# Patient Record
Sex: Male | Born: 1960 | Race: Black or African American | Hispanic: No | Smoking: Former smoker
Health system: Southern US, Community
[De-identification: ages and names within clinical notes are randomized; demographics above are authoritative.]

## PROBLEM LIST (undated history)

## (undated) DIAGNOSIS — Z923 Personal history of irradiation: Secondary | ICD-10-CM

## (undated) DIAGNOSIS — S0230XA Fracture of orbital floor, unspecified side, initial encounter for closed fracture: Secondary | ICD-10-CM

## (undated) DIAGNOSIS — I2699 Other pulmonary embolism without acute cor pulmonale: Secondary | ICD-10-CM

## (undated) DIAGNOSIS — C61 Malignant neoplasm of prostate: Secondary | ICD-10-CM

## (undated) HISTORY — DX: Personal history of irradiation: Z92.3

## (undated) HISTORY — DX: Malignant neoplasm of prostate: C61

## (undated) HISTORY — PX: SHOULDER ARTHROSCOPY: SHX128

---

## 1998-06-18 ENCOUNTER — Emergency Department (HOSPITAL_COMMUNITY): Admission: EM | Admit: 1998-06-18 | Discharge: 1998-06-18 | Payer: Self-pay | Admitting: Psychology

## 1998-12-23 ENCOUNTER — Encounter: Admission: RE | Admit: 1998-12-23 | Discharge: 1998-12-23 | Payer: Self-pay | Admitting: Family Medicine

## 2001-02-11 ENCOUNTER — Emergency Department (HOSPITAL_COMMUNITY): Admission: EM | Admit: 2001-02-11 | Discharge: 2001-02-11 | Payer: Self-pay | Admitting: Emergency Medicine

## 2001-02-11 ENCOUNTER — Encounter: Payer: Self-pay | Admitting: Emergency Medicine

## 2001-04-04 ENCOUNTER — Encounter: Admission: RE | Admit: 2001-04-04 | Discharge: 2001-04-04 | Payer: Self-pay | Admitting: Family Medicine

## 2001-09-04 ENCOUNTER — Emergency Department (HOSPITAL_COMMUNITY): Admission: EM | Admit: 2001-09-04 | Discharge: 2001-09-04 | Payer: Self-pay | Admitting: Emergency Medicine

## 2001-09-04 ENCOUNTER — Encounter: Payer: Self-pay | Admitting: Emergency Medicine

## 2005-05-28 ENCOUNTER — Emergency Department (HOSPITAL_COMMUNITY): Admission: EM | Admit: 2005-05-28 | Discharge: 2005-05-28 | Payer: Self-pay | Admitting: Family Medicine

## 2007-08-13 ENCOUNTER — Emergency Department (HOSPITAL_COMMUNITY): Admission: EM | Admit: 2007-08-13 | Discharge: 2007-08-13 | Payer: Self-pay | Admitting: Emergency Medicine

## 2009-11-11 ENCOUNTER — Emergency Department (HOSPITAL_COMMUNITY): Admission: EM | Admit: 2009-11-11 | Discharge: 2009-11-11 | Payer: Self-pay | Admitting: Family Medicine

## 2010-06-13 DIAGNOSIS — C61 Malignant neoplasm of prostate: Secondary | ICD-10-CM

## 2010-06-13 HISTORY — DX: Malignant neoplasm of prostate: C61

## 2010-11-12 HISTORY — PX: PROSTATECTOMY: SHX69

## 2010-11-14 ENCOUNTER — Emergency Department (HOSPITAL_COMMUNITY)
Admission: EM | Admit: 2010-11-14 | Discharge: 2010-11-15 | Payer: Self-pay | Source: Home / Self Care | Admitting: Emergency Medicine

## 2011-04-28 ENCOUNTER — Inpatient Hospital Stay (INDEPENDENT_AMBULATORY_CARE_PROVIDER_SITE_OTHER)
Admission: RE | Admit: 2011-04-28 | Discharge: 2011-04-28 | Disposition: A | Discharge status: Home / Self Care | Payer: Non-veteran care | Source: Ambulatory Visit | Attending: Family Medicine | Admitting: Family Medicine

## 2011-04-28 DIAGNOSIS — T148XXA Other injury of unspecified body region, initial encounter: Secondary | ICD-10-CM

## 2011-04-28 DIAGNOSIS — W540XXA Bitten by dog, initial encounter: Secondary | ICD-10-CM

## 2011-05-20 ENCOUNTER — Ambulatory Visit
Admission: RE | Admit: 2011-05-20 | Discharge: 2011-05-20 | Disposition: A | Discharge status: Home / Self Care | Payer: Non-veteran care | Source: Ambulatory Visit | Attending: Radiation Oncology | Admitting: Radiation Oncology

## 2011-05-20 DIAGNOSIS — Z510 Encounter for antineoplastic radiation therapy: Secondary | ICD-10-CM | POA: Insufficient documentation

## 2011-05-20 DIAGNOSIS — C61 Malignant neoplasm of prostate: Secondary | ICD-10-CM | POA: Insufficient documentation

## 2011-05-20 DIAGNOSIS — R5381 Other malaise: Secondary | ICD-10-CM | POA: Insufficient documentation

## 2011-09-11 ENCOUNTER — Ambulatory Visit
Admission: RE | Admit: 2011-09-11 | Discharge: 2011-09-11 | Disposition: A | Discharge status: Home / Self Care | Payer: Non-veteran care | Source: Ambulatory Visit | Attending: Radiation Oncology | Admitting: Radiation Oncology

## 2012-01-14 ENCOUNTER — Encounter: Payer: Self-pay | Admitting: Radiation Oncology

## 2012-01-15 ENCOUNTER — Ambulatory Visit: Payer: Non-veteran care | Attending: Radiation Oncology | Admitting: Radiation Oncology

## 2012-02-04 ENCOUNTER — Encounter: Payer: Self-pay | Admitting: *Deleted

## 2012-02-04 DIAGNOSIS — Z923 Personal history of irradiation: Secondary | ICD-10-CM | POA: Insufficient documentation

## 2012-02-04 NOTE — Progress Notes (Signed)
Sees Dr Jamal Collin, [facility]  Married, 2 children

## 2012-02-05 ENCOUNTER — Encounter: Payer: Self-pay | Admitting: Radiation Oncology

## 2012-02-05 ENCOUNTER — Ambulatory Visit
Admission: RE | Admit: 2012-02-05 | Discharge: 2012-02-05 | Disposition: A | Discharge status: Home / Self Care | Payer: Non-veteran care | Source: Ambulatory Visit | Attending: Radiation Oncology | Admitting: Radiation Oncology

## 2012-02-05 VITALS — BP 121/90 | HR 75 | Temp 97.7°F | Ht 67.0 in | Wt 172.3 lb

## 2012-02-05 DIAGNOSIS — C61 Malignant neoplasm of prostate: Secondary | ICD-10-CM

## 2012-02-05 NOTE — Progress Notes (Addendum)
[name] for Prostate Cancer. Continues on Casodex, and received a Lupron injection Feb. 2013. Hot flashes continue.  Nicholas Ayala reports that he has to wear depends because he has "leakage at night."  He asked about what he can do for this.  Given copy of instructions regarding Kegel exercises.    Nicholas Ayala also reports that whenever he urinates he has leakage from his rectal area and the leakage looks and smells like urine.  Using laxatives at times if constipated  Nicholas Ayala reports that for the past 2-3 weeks he wakes each morning and when standing he experiences "numbness, tingling and tenderness on the bottom of his feet.".  Impotence - Plan to have insertion of the Penile Pump.

## 2012-02-08 ENCOUNTER — Ambulatory Visit: Payer: Non-veteran care | Admitting: Radiation Oncology

## 2012-02-09 NOTE — Progress Notes (Signed)
CC:   Nicholas Collin, MD Lucille Passy, PA-C  DIAGNOSIS:  Prostate cancer.  INTERVAL HISTORY:  Nicholas Ayala returns to clinic today for followup.  He again completed his course of adjuvant radiotherapy to the prostatic fossa in August of 2012.  The patient indicates that he has continued on antihormonal treatment, both with  Casodex and Lupron.  He is having some hot flashes.  The patient has some stable symptoms including some urinary leakage, especially at night.  He has been given instructions regarding Kegel exercises.  The patient also, again, describes what he feels like urine leakage from the rectal area.  The patient is apparently closely followed by Urology at the [facility].  I am requesting additional records from there, but he assures me that he has discussed this issue with Urology and this has been worked up.  He is also planning to have a penile pump inserted as well, so I certainly know that he is being followed by them.  He denies any dysuria or hematuria, and any blood per rectum.  Therefore, I am not sure what to exactly make of his continued symptoms, but we previously discussed him making sure that he is seen by Neurology, and he has done this.  PHYSICAL EXAM:  Vital signs: Weight 172 pounds, blood pressure 121/98, pulse 75, and temperature 97.7.  Cardiovascular:  Regular rate and rhythm.  Respiratory: clear to auscultation.  IMPRESSION AND PLAN:  Nicholas Ayala appears to be doing satisfactorily at this time.  He has no new significant complaints related to his treatment today, and he is in good spirits.  I primarily wanted to make sure that he was not lost to followup in terms of him navigating through system at the [place], and we have discussed in some detail that he is being followed by Neurology for multiple ongoing issues.  I therefore will have him return to our clinic on a p.r.n. basis, and, as noted above, we are trying to gain some additional records so I can have this  for his records here with regards to what is transpiring at the [place].  I spent 15 minutes with Nicholas Ayala today, the majority of which was spent counseling him on his diagnosis and coordinating his care.    ______________________________ Radene Gunning, M.D., Ph.D. JSM/MEDQ  D:  02/09/2012  T:  02/09/2012  Job:  [id number]

## 2014-02-11 DIAGNOSIS — I2699 Other pulmonary embolism without acute cor pulmonale: Secondary | ICD-10-CM

## 2014-02-11 HISTORY — DX: Other pulmonary embolism without acute cor pulmonale: I26.99

## 2014-02-26 ENCOUNTER — Emergency Department (HOSPITAL_COMMUNITY): Payer: Non-veteran care

## 2014-02-26 ENCOUNTER — Inpatient Hospital Stay (HOSPITAL_COMMUNITY)
Admission: EM | Admit: 2014-02-26 | Discharge: 2014-03-12 | DRG: 113 | Disposition: A | Discharge status: Home Health | Payer: Non-veteran care | Attending: General Surgery | Admitting: General Surgery

## 2014-02-26 ENCOUNTER — Encounter (HOSPITAL_COMMUNITY): Payer: Self-pay | Admitting: Emergency Medicine

## 2014-02-26 DIAGNOSIS — I2699 Other pulmonary embolism without acute cor pulmonale: Secondary | ICD-10-CM | POA: Diagnosis present

## 2014-02-26 DIAGNOSIS — I10 Essential (primary) hypertension: Secondary | ICD-10-CM | POA: Diagnosis present

## 2014-02-26 DIAGNOSIS — S32402A Unspecified fracture of left acetabulum, initial encounter for closed fracture: Secondary | ICD-10-CM | POA: Diagnosis present

## 2014-02-26 DIAGNOSIS — I714 Abdominal aortic aneurysm, without rupture, unspecified: Secondary | ICD-10-CM | POA: Diagnosis present

## 2014-02-26 DIAGNOSIS — H11429 Conjunctival edema, unspecified eye: Secondary | ICD-10-CM | POA: Diagnosis present

## 2014-02-26 DIAGNOSIS — S0510XA Contusion of eyeball and orbital tissues, unspecified eye, initial encounter: Secondary | ICD-10-CM | POA: Diagnosis present

## 2014-02-26 DIAGNOSIS — R413 Other amnesia: Secondary | ICD-10-CM | POA: Diagnosis present

## 2014-02-26 DIAGNOSIS — S0230XA Fracture of orbital floor, unspecified side, initial encounter for closed fracture: Secondary | ICD-10-CM

## 2014-02-26 DIAGNOSIS — F17200 Nicotine dependence, unspecified, uncomplicated: Secondary | ICD-10-CM | POA: Diagnosis present

## 2014-02-26 DIAGNOSIS — H209 Unspecified iridocyclitis: Secondary | ICD-10-CM | POA: Diagnosis present

## 2014-02-26 DIAGNOSIS — F1010 Alcohol abuse, uncomplicated: Secondary | ICD-10-CM | POA: Diagnosis present

## 2014-02-26 DIAGNOSIS — R404 Transient alteration of awareness: Secondary | ICD-10-CM | POA: Diagnosis present

## 2014-02-26 DIAGNOSIS — S32511A Fracture of superior rim of right pubis, initial encounter for closed fracture: Secondary | ICD-10-CM | POA: Diagnosis present

## 2014-02-26 DIAGNOSIS — H0520 Unspecified exophthalmos: Secondary | ICD-10-CM | POA: Diagnosis present

## 2014-02-26 DIAGNOSIS — S0292XA Unspecified fracture of facial bones, initial encounter for closed fracture: Secondary | ICD-10-CM | POA: Diagnosis present

## 2014-02-26 DIAGNOSIS — R079 Chest pain, unspecified: Secondary | ICD-10-CM | POA: Diagnosis present

## 2014-02-26 DIAGNOSIS — Z923 Personal history of irradiation: Secondary | ICD-10-CM | POA: Diagnosis not present

## 2014-02-26 DIAGNOSIS — Z8546 Personal history of malignant neoplasm of prostate: Secondary | ICD-10-CM | POA: Diagnosis not present

## 2014-02-26 DIAGNOSIS — D62 Acute posthemorrhagic anemia: Secondary | ICD-10-CM | POA: Diagnosis not present

## 2014-02-26 DIAGNOSIS — X58XXXA Exposure to other specified factors, initial encounter: Secondary | ICD-10-CM

## 2014-02-26 DIAGNOSIS — I723 Aneurysm of iliac artery: Secondary | ICD-10-CM | POA: Diagnosis present

## 2014-02-26 DIAGNOSIS — S32509A Unspecified fracture of unspecified pubis, initial encounter for closed fracture: Secondary | ICD-10-CM | POA: Diagnosis present

## 2014-02-26 DIAGNOSIS — S32409A Unspecified fracture of unspecified acetabulum, initial encounter for closed fracture: Secondary | ICD-10-CM | POA: Diagnosis present

## 2014-02-26 DIAGNOSIS — S0280XA Fracture of other specified skull and facial bones, unspecified side, initial encounter for closed fracture: Secondary | ICD-10-CM | POA: Diagnosis present

## 2014-02-26 DIAGNOSIS — H532 Diplopia: Secondary | ICD-10-CM | POA: Diagnosis present

## 2014-02-26 DIAGNOSIS — S32591A Other specified fracture of right pubis, initial encounter for closed fracture: Secondary | ICD-10-CM | POA: Diagnosis present

## 2014-02-26 DIAGNOSIS — S329XXA Fracture of unspecified parts of lumbosacral spine and pelvis, initial encounter for closed fracture: Secondary | ICD-10-CM | POA: Diagnosis not present

## 2014-02-26 HISTORY — DX: Fracture of orbital floor, unspecified side, initial encounter for closed fracture: S02.30XA

## 2014-02-26 LAB — CBC WITH DIFFERENTIAL/PLATELET
BASOS PCT: 0 % (ref 0–1)
Basophils Absolute: 0 10*3/uL (ref 0.0–0.1)
EOS ABS: 0 10*3/uL (ref 0.0–0.7)
EOS PCT: 0 % (ref 0–5)
HEMATOCRIT: 36.7 % — AB (ref 39.0–52.0)
HEMOGLOBIN: 13.1 g/dL (ref 13.0–17.0)
LYMPHS ABS: 1 10*3/uL (ref 0.7–4.0)
Lymphocytes Relative: 8 % — ABNORMAL LOW (ref 12–46)
MCH: 32.6 pg (ref 26.0–34.0)
MCHC: 35.7 g/dL (ref 30.0–36.0)
MCV: 91.3 fL (ref 78.0–100.0)
MONO ABS: 0.7 10*3/uL (ref 0.1–1.0)
MONOS PCT: 6 % (ref 3–12)
Neutro Abs: 10.6 10*3/uL — ABNORMAL HIGH (ref 1.7–7.7)
Neutrophils Relative %: 86 % — ABNORMAL HIGH (ref 43–77)
Platelets: 262 10*3/uL (ref 150–400)
RBC: 4.02 MIL/uL — AB (ref 4.22–5.81)
RDW: 13.6 % (ref 11.5–15.5)
WBC: 12.4 10*3/uL — ABNORMAL HIGH (ref 4.0–10.5)

## 2014-02-26 LAB — I-STAT CHEM 8, ED
BUN: 14 mg/dL (ref 6–23)
CALCIUM ION: 1.11 mmol/L — AB (ref 1.12–1.23)
CREATININE: 1.6 mg/dL — AB (ref 0.50–1.35)
Chloride: 109 mEq/L (ref 96–112)
GLUCOSE: 98 mg/dL (ref 70–99)
HEMATOCRIT: 40 % (ref 39.0–52.0)
HEMOGLOBIN: 13.6 g/dL (ref 13.0–17.0)
POTASSIUM: 3.6 meq/L — AB (ref 3.7–5.3)
Sodium: 143 mEq/L (ref 137–147)
TCO2: 18 mmol/L (ref 0–100)

## 2014-02-26 LAB — ETHANOL: Alcohol, Ethyl (B): 288 mg/dL — ABNORMAL HIGH (ref 0–11)

## 2014-02-26 MED ORDER — TETANUS-DIPHTH-ACELL PERTUSSIS 5-2.5-18.5 LF-MCG/0.5 IM SUSP
0.5000 mL | Freq: Once | INTRAMUSCULAR | Status: AC
  Administered 2014-02-26: 0.5 mL via INTRAMUSCULAR
  Filled 2014-02-26: qty 0.5

## 2014-02-26 MED ORDER — MORPHINE SULFATE 4 MG/ML IJ SOLN
4.0000 mg | Freq: Once | INTRAMUSCULAR | Status: AC
  Administered 2014-02-26: 4 mg via INTRAVENOUS
  Filled 2014-02-26: qty 1

## 2014-02-26 MED ORDER — ONDANSETRON HCL 4 MG/2ML IJ SOLN
4.0000 mg | Freq: Once | INTRAMUSCULAR | Status: AC
  Administered 2014-02-26: 4 mg via INTRAVENOUS
  Filled 2014-02-26: qty 2

## 2014-02-26 MED ORDER — SODIUM CHLORIDE 0.9 % IV BOLUS (SEPSIS)
1000.0000 mL | Freq: Once | INTRAVENOUS | Status: AC
  Administered 2014-02-26: 1000 mL via INTRAVENOUS

## 2014-02-26 MED ORDER — IOHEXOL 300 MG/ML  SOLN
100.0000 mL | Freq: Once | INTRAMUSCULAR | Status: AC | PRN
  Administered 2014-02-26: 100 mL via INTRAVENOUS

## 2014-02-26 NOTE — ED Notes (Signed)
Wife, Stanton Kidney [phone]
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
## 2014-02-26 NOTE — ED Notes (Signed)
Pt on LSB at this time.
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
## 2014-02-27 ENCOUNTER — Inpatient Hospital Stay (HOSPITAL_COMMUNITY): Payer: Non-veteran care

## 2014-02-27 DIAGNOSIS — H532 Diplopia: Secondary | ICD-10-CM | POA: Diagnosis present

## 2014-02-27 DIAGNOSIS — IMO0002 Reserved for concepts with insufficient information to code with codable children: Secondary | ICD-10-CM

## 2014-02-27 DIAGNOSIS — I10 Essential (primary) hypertension: Secondary | ICD-10-CM | POA: Diagnosis present

## 2014-02-27 DIAGNOSIS — S329XXA Fracture of unspecified parts of lumbosacral spine and pelvis, initial encounter for closed fracture: Secondary | ICD-10-CM | POA: Diagnosis present

## 2014-02-27 DIAGNOSIS — S0292XA Unspecified fracture of facial bones, initial encounter for closed fracture: Secondary | ICD-10-CM | POA: Diagnosis present

## 2014-02-27 DIAGNOSIS — S0280XA Fracture of other specified skull and facial bones, unspecified side, initial encounter for closed fracture: Secondary | ICD-10-CM | POA: Diagnosis present

## 2014-02-27 DIAGNOSIS — X58XXXA Exposure to other specified factors, initial encounter: Secondary | ICD-10-CM | POA: Diagnosis not present

## 2014-02-27 DIAGNOSIS — I723 Aneurysm of iliac artery: Secondary | ICD-10-CM | POA: Diagnosis present

## 2014-02-27 DIAGNOSIS — S0510XA Contusion of eyeball and orbital tissues, unspecified eye, initial encounter: Secondary | ICD-10-CM | POA: Diagnosis present

## 2014-02-27 DIAGNOSIS — Z923 Personal history of irradiation: Secondary | ICD-10-CM | POA: Diagnosis not present

## 2014-02-27 DIAGNOSIS — R404 Transient alteration of awareness: Secondary | ICD-10-CM | POA: Diagnosis present

## 2014-02-27 DIAGNOSIS — S32402A Unspecified fracture of left acetabulum, initial encounter for closed fracture: Secondary | ICD-10-CM | POA: Diagnosis present

## 2014-02-27 DIAGNOSIS — I714 Abdominal aortic aneurysm, without rupture, unspecified: Secondary | ICD-10-CM | POA: Diagnosis present

## 2014-02-27 DIAGNOSIS — S32509A Unspecified fracture of unspecified pubis, initial encounter for closed fracture: Secondary | ICD-10-CM | POA: Diagnosis present

## 2014-02-27 DIAGNOSIS — H0520 Unspecified exophthalmos: Secondary | ICD-10-CM | POA: Diagnosis present

## 2014-02-27 DIAGNOSIS — H11429 Conjunctival edema, unspecified eye: Secondary | ICD-10-CM | POA: Diagnosis present

## 2014-02-27 DIAGNOSIS — R079 Chest pain, unspecified: Secondary | ICD-10-CM | POA: Diagnosis present

## 2014-02-27 DIAGNOSIS — F1010 Alcohol abuse, uncomplicated: Secondary | ICD-10-CM | POA: Diagnosis present

## 2014-02-27 DIAGNOSIS — I2699 Other pulmonary embolism without acute cor pulmonale: Secondary | ICD-10-CM | POA: Diagnosis present

## 2014-02-27 DIAGNOSIS — S0230XA Fracture of orbital floor, unspecified side, initial encounter for closed fracture: Secondary | ICD-10-CM | POA: Diagnosis present

## 2014-02-27 DIAGNOSIS — D62 Acute posthemorrhagic anemia: Secondary | ICD-10-CM | POA: Diagnosis not present

## 2014-02-27 DIAGNOSIS — S32409A Unspecified fracture of unspecified acetabulum, initial encounter for closed fracture: Secondary | ICD-10-CM | POA: Diagnosis present

## 2014-02-27 DIAGNOSIS — H209 Unspecified iridocyclitis: Secondary | ICD-10-CM | POA: Diagnosis present

## 2014-02-27 DIAGNOSIS — R413 Other amnesia: Secondary | ICD-10-CM | POA: Diagnosis present

## 2014-02-27 DIAGNOSIS — Z8546 Personal history of malignant neoplasm of prostate: Secondary | ICD-10-CM | POA: Diagnosis not present

## 2014-02-27 DIAGNOSIS — F17200 Nicotine dependence, unspecified, uncomplicated: Secondary | ICD-10-CM | POA: Diagnosis present

## 2014-02-27 LAB — BASIC METABOLIC PANEL
BUN: 15 mg/dL (ref 6–23)
CALCIUM: 8.4 mg/dL (ref 8.4–10.5)
CO2: 16 mEq/L — ABNORMAL LOW (ref 19–32)
Chloride: 107 mEq/L (ref 96–112)
Creatinine, Ser: 1.13 mg/dL (ref 0.50–1.35)
GFR calc non Af Amer: 72 mL/min — ABNORMAL LOW (ref >90)
GFR, EST AFRICAN AMERICAN: 83 mL/min — AB (ref >90)
Glucose, Bld: 99 mg/dL (ref 70–99)
Potassium: 4.1 mEq/L (ref 3.7–5.3)
SODIUM: 143 meq/L (ref 137–147)

## 2014-02-27 LAB — CBC
HCT: 33.2 % — ABNORMAL LOW (ref 39.0–52.0)
Hemoglobin: 11.5 g/dL — ABNORMAL LOW (ref 13.0–17.0)
MCH: 32.1 pg (ref 26.0–34.0)
MCHC: 34.6 g/dL (ref 30.0–36.0)
MCV: 92.7 fL (ref 78.0–100.0)
PLATELETS: 231 10*3/uL (ref 150–400)
RBC: 3.58 MIL/uL — ABNORMAL LOW (ref 4.22–5.81)
RDW: 13.9 % (ref 11.5–15.5)
WBC: 11.1 10*3/uL — ABNORMAL HIGH (ref 4.0–10.5)

## 2014-02-27 LAB — MRSA PCR SCREENING: MRSA BY PCR: NEGATIVE

## 2014-02-27 MED ORDER — ONDANSETRON HCL 4 MG/2ML IJ SOLN
4.0000 mg | Freq: Four times a day (QID) | INTRAMUSCULAR | Status: DC | PRN
Start: 2014-02-27 — End: 2014-03-12
  Administered 2014-03-04 – 2014-03-08 (×2): 4 mg via INTRAVENOUS
  Filled 2014-02-27 (×2): qty 2

## 2014-02-27 MED ORDER — MORPHINE SULFATE 2 MG/ML IJ SOLN
2.0000 mg | INTRAMUSCULAR | Status: DC | PRN
  Administered 2014-02-27 – 2014-02-28 (×14): 2 mg via INTRAVENOUS
  Filled 2014-02-27 (×14): qty 1

## 2014-02-27 MED ORDER — KCL IN DEXTROSE-NACL 20-5-0.9 MEQ/L-%-% IV SOLN
INTRAVENOUS | Status: DC
  Administered 2014-02-27 – 2014-03-07 (×11): via INTRAVENOUS
  Administered 2014-03-08: 50 mL/h via INTRAVENOUS
  Filled 2014-02-27 (×20): qty 1000

## 2014-02-27 MED ORDER — PANTOPRAZOLE SODIUM 40 MG PO TBEC
40.0000 mg | DELAYED_RELEASE_TABLET | Freq: Every day | ORAL | Status: DC
  Administered 2014-03-01 – 2014-03-06 (×6): 40 mg via ORAL
  Filled 2014-02-27 (×6): qty 1

## 2014-02-27 MED ORDER — ONDANSETRON HCL 4 MG PO TABS: 4.0000 mg | ORAL_TABLET | Freq: Four times a day (QID) | ORAL | Status: DC | PRN

## 2014-02-27 MED ORDER — PANTOPRAZOLE SODIUM 40 MG IV SOLR
40.0000 mg | Freq: Every day | INTRAVENOUS | Status: DC
  Administered 2014-02-27 – 2014-02-28 (×2): 40 mg via INTRAVENOUS
  Filled 2014-02-27 (×3): qty 40
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
## 2014-02-27 NOTE — Progress Notes (Signed)
UR completed.  Meril Dray, RN BSN [facility] CCM Trauma/Neuro ICU Case Manager [phone]  
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
## 2014-02-27 NOTE — Progress Notes (Signed)
Patient ID: Nicholas Ayala, male   DOB: [birth year], 53 y.o.   MRN: [id number] HD stable. In  ICU now. Ortho eval ongoing, noted F/U pelvic film. Georganna Skeans, MD, MPH, FACS Trauma: [phone] General Surgery: [phone]

## 2014-02-27 NOTE — Progress Notes (Signed)
*  PRELIMINARY RESULTS* Vascular Ultrasound Lower extremity venous duplex has been completed.  Preliminary findings: No evidence of DVT.   Landry Mellow, RDMS, RVT  02/27/2014, 10:11 AM

## 2014-02-27 NOTE — ED Notes (Signed)
Pt using urinal and answering/talking on phone.
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
## 2014-02-27 NOTE — Progress Notes (Signed)
Patient ID: Nicholas Ayala, male   DOB: 14-Jan-1961, 53 y.o.   MRN: [id number]  Consult received last night I plan to see and make recommendations later today  In interim will order portable AP pelvis in support of CT  For now NWB, bed rest until fractures better assessed

## 2014-02-28 MED ORDER — HYDROMORPHONE HCL PF 1 MG/ML IJ SOLN
0.5000 mg | INTRAMUSCULAR | Status: DC | PRN
  Administered 2014-02-28 – 2014-03-01 (×4): 2 mg via INTRAVENOUS
  Filled 2014-02-28 (×4): qty 2

## 2014-02-28 MED ORDER — CHLORHEXIDINE GLUCONATE 0.12 % MT SOLN
OROMUCOSAL | Status: AC
  Filled 2014-02-28: qty 15

## 2014-02-28 MED ORDER — OXYCODONE-ACETAMINOPHEN 5-325 MG PO TABS
1.0000 | ORAL_TABLET | ORAL | Status: DC | PRN
  Administered 2014-02-28 – 2014-03-01 (×4): 2 via ORAL
  Filled 2014-02-28 (×4): qty 2
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
## 2014-02-28 NOTE — Progress Notes (Signed)
Patient transferred to [name] with family and belongings at beside. [name] at beside. No new problems at this time. Nicholas Ayala

## 2014-02-28 NOTE — Progress Notes (Signed)
Talked with the orthopedic MD, Dr. Alvan Dame regarding activity progression. Left lower extremity is non weight bearing, and right lower extremity is weight bearing as tolerated. See orders. Plan is to work with PT. No new problems at this time. Will continue to monitor patient. Sandre Kitty

## 2014-02-28 NOTE — Progress Notes (Signed)
PT Cancellation Note  Patient Details Name: Nicholas Ayala MRN: [id number] DOB: Jul 14, 1961   Cancelled Treatment:    Reason Eval Not Completed: Patient not medically ready. Per ortho note, awaiting their decision if pt has Lt acetabular fx and what weightbearing restrictions he will have on LEFT leg. Will proceed once clarified by ortho.   Treshun Wold 02/28/2014, 11:30 AM Pager [phone]
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
## 2014-03-01 ENCOUNTER — Inpatient Hospital Stay (HOSPITAL_COMMUNITY): Payer: Non-veteran care

## 2014-03-01 DIAGNOSIS — S32511A Fracture of superior rim of right pubis, initial encounter for closed fracture: Secondary | ICD-10-CM | POA: Diagnosis present

## 2014-03-01 DIAGNOSIS — D62 Acute posthemorrhagic anemia: Secondary | ICD-10-CM

## 2014-03-01 DIAGNOSIS — S32591A Other specified fracture of right pubis, initial encounter for closed fracture: Secondary | ICD-10-CM | POA: Diagnosis present

## 2014-03-01 DIAGNOSIS — I2699 Other pulmonary embolism without acute cor pulmonale: Secondary | ICD-10-CM | POA: Diagnosis present

## 2014-03-01 LAB — BASIC METABOLIC PANEL
BUN: 9 mg/dL (ref 6–23)
CO2: 22 mEq/L (ref 19–32)
CREATININE: 0.76 mg/dL (ref 0.50–1.35)
Calcium: 9.4 mg/dL (ref 8.4–10.5)
Chloride: 101 mEq/L (ref 96–112)
Glucose, Bld: 137 mg/dL — ABNORMAL HIGH (ref 70–99)
POTASSIUM: 4.2 meq/L (ref 3.7–5.3)
Sodium: 138 mEq/L (ref 137–147)

## 2014-03-01 LAB — PROTIME-INR
INR: 1.01 (ref 0.00–1.49)
PROTHROMBIN TIME: 13.1 s (ref 11.6–15.2)

## 2014-03-01 LAB — HEPARIN LEVEL (UNFRACTIONATED): HEPARIN UNFRACTIONATED: 0.23 [IU]/mL — AB (ref 0.30–0.70)

## 2014-03-01 LAB — APTT: aPTT: 28 s (ref 24–37)

## 2014-03-01 MED ORDER — SERTRALINE HCL 100 MG PO TABS: 100.0000 mg | ORAL_TABLET | Freq: Every day | ORAL | Status: DC

## 2014-03-01 MED ORDER — OXYCODONE HCL 5 MG PO TABS
10.0000 mg | ORAL_TABLET | ORAL | Status: DC | PRN
  Administered 2014-03-01 – 2014-03-05 (×11): 15 mg via ORAL
  Administered 2014-03-05: 20 mg via ORAL
  Administered 2014-03-05: 15 mg via ORAL
  Administered 2014-03-06 (×2): 20 mg via ORAL
  Filled 2014-03-01 (×5): qty 3
  Filled 2014-03-01: qty 4
  Filled 2014-03-01: qty 3
  Filled 2014-03-01: qty 4
  Filled 2014-03-01 (×3): qty 3
  Filled 2014-03-01: qty 4
  Filled 2014-03-01 (×3): qty 3

## 2014-03-01 MED ORDER — HEPARIN (PORCINE) IN NACL 100-0.45 UNIT/ML-% IJ SOLN
1150.0000 [IU]/h | INTRAMUSCULAR | Status: DC
  Administered 2014-03-01: 1000 [IU]/h via INTRAVENOUS
  Filled 2014-03-01 (×2): qty 250

## 2014-03-01 MED ORDER — CITALOPRAM HYDROBROMIDE 10 MG PO TABS
10.0000 mg | ORAL_TABLET | Freq: Every day | ORAL | Status: DC
  Administered 2014-03-01 – 2014-03-12 (×12): 10 mg via ORAL
  Filled 2014-03-01 (×12): qty 1

## 2014-03-01 MED ORDER — CALCIUM CARBONATE-VITAMIN D 500-200 MG-UNIT PO TABS
1.0000 | ORAL_TABLET | Freq: Two times a day (BID) | ORAL | Status: DC
  Administered 2014-03-01 – 2014-03-12 (×23): 1 via ORAL
  Filled 2014-03-01 (×29): qty 1

## 2014-03-01 MED ORDER — HYDROMORPHONE HCL PF 1 MG/ML IJ SOLN
0.5000 mg | INTRAMUSCULAR | Status: DC | PRN
  Administered 2014-03-02 (×2): 0.5 mg via INTRAVENOUS
  Filled 2014-03-01 (×2): qty 1

## 2014-03-01 MED ORDER — HEPARIN BOLUS VIA INFUSION
2000.0000 [IU] | Freq: Once | INTRAVENOUS | Status: AC
  Administered 2014-03-01: 2000 [IU] via INTRAVENOUS
  Filled 2014-03-01: qty 2000

## 2014-03-01 MED ORDER — BICALUTAMIDE 50 MG PO TABS
50.0000 mg | ORAL_TABLET | Freq: Every day | ORAL | Status: DC
Start: 2014-03-01 — End: 2014-03-12
  Administered 2014-03-01 – 2014-03-12 (×12): 50 mg via ORAL
  Filled 2014-03-01 (×12): qty 1

## 2014-03-01 MED ORDER — TRAZODONE HCL 50 MG PO TABS
50.0000 mg | ORAL_TABLET | Freq: Every day | ORAL | Status: DC
  Administered 2014-03-02 – 2014-03-11 (×10): 50 mg via ORAL
  Filled 2014-03-01 (×16): qty 1

## 2014-03-01 NOTE — Progress Notes (Signed)
Doppler studies of the lower extremities are negative.  Subsegmental PEs are real and patient being treated for PE.  I have viewed the study and would like to know the extent of disease a bit better.  Will get formal PE study.  This patient has been seen and I agree with the findings and treatment plan.  [name]. Dahlia Bailiff, MD, [place] [phone] (pager) [phone] (direct pager) Trauma Surgeon
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
## 2014-03-01 NOTE — Progress Notes (Addendum)
PTD #3 ADDENDUM 1503 03/01/14 Pt and wife called with additional questions and concerns. Discussed no surgery is option , but diplopia may be permanent in that case. Pt asked that I call his PCP through [facility] Dr. Geronimo Running [phone] [place] office. Spoke to Environmental consultant; doctor out for rest of day. Provided my contact information. Pt and wife will call again with questions or if they do not want to proceed with surgery at this time.   Pt had emesis with foods, tolerating liquids, no nausea C/o double vision worse when looking to left Starting on heparin gtt  PE Regular Clear EOMI Occlusion normal, normal mouth opening  A/P Continued diplopia in setting of orbital floor fracture on left. Will plan open treatment. Discussed tranconjunctival vs lower lid incision, use of permanent prosthetic material, expected bruising numbness of cheeks. Also discussed that blindness from significant bleeding is a risk, esp in setting of blood thinner use. Will plan hold heparin 6 hrs prior. This was discussed with trauma and orders written. Spoke on phone with wife to discuss plan.  OR am 03/02/14  Irene Limbo, MD [facility] Plastic [facility] Reconstruct[facility] Surgery [phone]
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
## 2014-03-01 NOTE — Progress Notes (Signed)
Rehab Admissions Coordinator Note:  Patient was screened by Retta Diones for appropriateness for an Inpatient Acute Rehab Consult.  At this time, we are recommending [place]. Patient has [employer] insurance and we cannot get authorization from [place] for acute inpatient rehab admission.  Best option is to seek SNF through [place] to see if they will cover.  Call me for questions.  Retta Diones 03/01/2014, 12:50 PM  I can be reached at [phone].
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
## 2014-03-01 NOTE — Progress Notes (Signed)
Patient ID: Nicholas Ayala, male   DOB: June 23, 1961, 53 y.o.   MRN: [id number]  Had a chance to review patients radiographs with radiology  Plain films correlate well with regards to the rami fractures on the right  CT scan shows non displaced left acetabular fracture  Both of these fractures will heal without need for surgery Will treat non-operatively  PT recs orderd  Non-weight bearing left lower extremity  WBAT on the right for transfers This will be required for probably 6 weeks Will follow up in the office in 2 week intervals
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
## 2014-03-02 ENCOUNTER — Inpatient Hospital Stay (HOSPITAL_COMMUNITY): Payer: Non-veteran care | Admitting: Certified Registered Nurse Anesthetist

## 2014-03-02 ENCOUNTER — Encounter (HOSPITAL_COMMUNITY): Payer: Non-veteran care | Admitting: Certified Registered Nurse Anesthetist

## 2014-03-02 ENCOUNTER — Inpatient Hospital Stay (HOSPITAL_COMMUNITY): Payer: Non-veteran care

## 2014-03-02 ENCOUNTER — Encounter (HOSPITAL_COMMUNITY): Admission: EM | Disposition: A | Discharge status: Home Health | Payer: Self-pay | Source: Home / Self Care

## 2014-03-02 HISTORY — PX: ORIF ORBITAL FRACTURE: SHX5312

## 2014-03-02 LAB — CBC
HCT: 31.1 % — ABNORMAL LOW (ref 39.0–52.0)
Hemoglobin: 10.6 g/dL — ABNORMAL LOW (ref 13.0–17.0)
MCH: 31.6 pg (ref 26.0–34.0)
MCHC: 34.1 g/dL (ref 30.0–36.0)
MCV: 92.8 fL (ref 78.0–100.0)
PLATELETS: 217 10*3/uL (ref 150–400)
RBC: 3.35 MIL/uL — ABNORMAL LOW (ref 4.22–5.81)
RDW: 13.4 % (ref 11.5–15.5)
WBC: 4.6 10*3/uL (ref 4.0–10.5)

## 2014-03-02 LAB — HEPARIN LEVEL (UNFRACTIONATED): HEPARIN UNFRACTIONATED: 0.56 [IU]/mL (ref 0.30–0.70)

## 2014-03-02 SURGERY — OPEN REDUCTION INTERNAL FIXATION (ORIF) ORBITAL FRACTURE
Anesthesia: General | Laterality: Left

## 2014-03-02 MED ORDER — PROPOFOL 10 MG/ML IV BOLUS
INTRAVENOUS | Status: AC
  Filled 2014-03-02: qty 20

## 2014-03-02 MED ORDER — HYDRALAZINE HCL 20 MG/ML IJ SOLN
5.0000 mg | INTRAMUSCULAR | Status: DC | PRN
  Administered 2014-03-08: 5 mg via INTRAVENOUS
  Filled 2014-03-02: qty 1

## 2014-03-02 MED ORDER — SUCCINYLCHOLINE CHLORIDE 20 MG/ML IJ SOLN
INTRAMUSCULAR | Status: AC
  Filled 2014-03-02: qty 1

## 2014-03-02 MED ORDER — PHENYLEPHRINE 40 MCG/ML (10ML) SYRINGE FOR IV PUSH (FOR BLOOD PRESSURE SUPPORT)
PREFILLED_SYRINGE | INTRAVENOUS | Status: AC
  Filled 2014-03-02: qty 10

## 2014-03-02 MED ORDER — TOBRAMYCIN 0.3 % OP OINT
TOPICAL_OINTMENT | OPHTHALMIC | Status: DC | PRN
  Administered 2014-03-02: 1 via OPHTHALMIC

## 2014-03-02 MED ORDER — METOPROLOL TARTRATE 1 MG/ML IV SOLN
5.0000 mg | Freq: Four times a day (QID) | INTRAVENOUS | Status: DC
  Administered 2014-03-02 – 2014-03-03 (×3): 5 mg via INTRAVENOUS
  Filled 2014-03-02 (×7): qty 5

## 2014-03-02 MED ORDER — LACTATED RINGERS IV SOLN
INTRAVENOUS | Status: DC
  Administered 2014-03-02: 11:00:00 via INTRAVENOUS

## 2014-03-02 MED ORDER — HYDROMORPHONE HCL PF 1 MG/ML IJ SOLN: 0.2500 mg | INTRAMUSCULAR | Status: DC | PRN

## 2014-03-02 MED ORDER — LIDOCAINE-EPINEPHRINE 1 %-1:100000 IJ SOLN
INTRAMUSCULAR | Status: AC
  Filled 2014-03-02: qty 1

## 2014-03-02 MED ORDER — MIDAZOLAM HCL 2 MG/2ML IJ SOLN
INTRAMUSCULAR | Status: AC
  Filled 2014-03-02: qty 2

## 2014-03-02 MED ORDER — FENTANYL CITRATE 0.05 MG/ML IJ SOLN
INTRAMUSCULAR | Status: DC | PRN
Start: 2014-03-02 — End: 2014-03-02
  Administered 2014-03-02 (×3): 50 ug via INTRAVENOUS
  Administered 2014-03-02: 125 ug via INTRAVENOUS
  Administered 2014-03-02: 75 ug via INTRAVENOUS

## 2014-03-02 MED ORDER — OXYMETAZOLINE HCL 0.05 % NA SOLN
NASAL | Status: AC
  Filled 2014-03-02: qty 15

## 2014-03-02 MED ORDER — SODIUM CHLORIDE 0.9 % IJ SOLN
INTRAMUSCULAR | Status: AC
  Filled 2014-03-02: qty 10

## 2014-03-02 MED ORDER — LACTATED RINGERS IV SOLN
INTRAVENOUS | Status: DC | PRN
  Administered 2014-03-02 (×2): via INTRAVENOUS

## 2014-03-02 MED ORDER — LORAZEPAM 2 MG/ML IJ SOLN: 1.0000 mg | Freq: Four times a day (QID) | INTRAMUSCULAR | Status: DC | PRN

## 2014-03-02 MED ORDER — TOBRAMYCIN-DEXAMETHASONE 0.3-0.1 % OP OINT
TOPICAL_OINTMENT | OPHTHALMIC | Status: AC
  Filled 2014-03-02: qty 3.5

## 2014-03-02 MED ORDER — PROPOFOL 10 MG/ML IV BOLUS
INTRAVENOUS | Status: DC | PRN
  Administered 2014-03-02: 200 mg via INTRAVENOUS
  Administered 2014-03-02: 50 mg via INTRAVENOUS

## 2014-03-02 MED ORDER — ONDANSETRON HCL 4 MG/2ML IJ SOLN
INTRAMUSCULAR | Status: AC
  Filled 2014-03-02: qty 2

## 2014-03-02 MED ORDER — LABETALOL HCL 5 MG/ML IV SOLN
INTRAVENOUS | Status: DC | PRN
  Administered 2014-03-02 (×3): 5 mg via INTRAVENOUS

## 2014-03-02 MED ORDER — ROCURONIUM BROMIDE 50 MG/5ML IV SOLN
INTRAVENOUS | Status: AC
  Filled 2014-03-02: qty 1

## 2014-03-02 MED ORDER — ROCURONIUM BROMIDE 100 MG/10ML IV SOLN
INTRAVENOUS | Status: DC | PRN
  Administered 2014-03-02: 40 mg via INTRAVENOUS

## 2014-03-02 MED ORDER — ONDANSETRON HCL 4 MG/2ML IJ SOLN
INTRAMUSCULAR | Status: DC | PRN
  Administered 2014-03-02: 4 mg via INTRAVENOUS

## 2014-03-02 MED ORDER — BACITRACIN ZINC 500 UNIT/GM EX OINT
TOPICAL_OINTMENT | CUTANEOUS | Status: AC
  Filled 2014-03-02: qty 15

## 2014-03-02 MED ORDER — TOBRAMYCIN-DEXAMETHASONE 0.3-0.1 % OP OINT
TOPICAL_OINTMENT | Freq: Two times a day (BID) | OPHTHALMIC | Status: DC
  Administered 2014-03-02: 1 via OPHTHALMIC
  Administered 2014-03-02 – 2014-03-04 (×5): via OPHTHALMIC
  Administered 2014-03-05: 1 via OPHTHALMIC
  Administered 2014-03-06: 01:00:00 via OPHTHALMIC
  Filled 2014-03-02 (×2): qty 3.5

## 2014-03-02 MED ORDER — GLYCOPYRROLATE 0.2 MG/ML IJ SOLN
INTRAMUSCULAR | Status: DC | PRN
  Administered 2014-03-02: 0.6 mg via INTRAVENOUS

## 2014-03-02 MED ORDER — FENTANYL CITRATE 0.05 MG/ML IJ SOLN
INTRAMUSCULAR | Status: AC
  Filled 2014-03-02: qty 5

## 2014-03-02 MED ORDER — LIDOCAINE HCL (CARDIAC) 20 MG/ML IV SOLN
INTRAVENOUS | Status: AC
  Filled 2014-03-02: qty 5

## 2014-03-02 MED ORDER — EPHEDRINE SULFATE 50 MG/ML IJ SOLN
INTRAMUSCULAR | Status: AC
  Filled 2014-03-02: qty 1

## 2014-03-02 MED ORDER — NEOSTIGMINE METHYLSULFATE 1 MG/ML IJ SOLN
INTRAMUSCULAR | Status: DC | PRN
  Administered 2014-03-02: 3 mg via INTRAVENOUS

## 2014-03-02 MED ORDER — LIDOCAINE HCL (CARDIAC) 20 MG/ML IV SOLN
INTRAVENOUS | Status: DC | PRN
  Administered 2014-03-02: 80 mg via INTRAVENOUS

## 2014-03-02 MED ORDER — BUPIVACAINE-EPINEPHRINE PF 0.25-1:200000 % IJ SOLN
INTRAMUSCULAR | Status: AC
  Filled 2014-03-02: qty 30

## 2014-03-02 MED ORDER — CEFAZOLIN SODIUM-DEXTROSE 2-3 GM-% IV SOLR
INTRAVENOUS | Status: DC | PRN
  Administered 2014-03-02: 2 g via INTRAVENOUS

## 2014-03-02 MED ORDER — CEFAZOLIN SODIUM 1-5 GM-% IV SOLN
1.0000 g | Freq: Three times a day (TID) | INTRAVENOUS | Status: AC
  Administered 2014-03-02 – 2014-03-03 (×3): 1 g via INTRAVENOUS
  Filled 2014-03-02 (×3): qty 50

## 2014-03-02 MED ORDER — BSS IO SOLN
INTRAOCULAR | Status: AC
  Filled 2014-03-02: qty 15

## 2014-03-02 MED ORDER — LIDOCAINE-EPINEPHRINE 1 %-1:100000 IJ SOLN
INTRAMUSCULAR | Status: DC | PRN
  Administered 2014-03-02: 20 mL

## 2014-03-02 MED ORDER — CEFAZOLIN (ANCEF) 1 G IV SOLR: 1.0000 g | Freq: Three times a day (TID) | INTRAVENOUS | Status: DC

## 2014-03-02 MED ORDER — MIDAZOLAM HCL 5 MG/5ML IJ SOLN
INTRAMUSCULAR | Status: DC | PRN
  Administered 2014-03-02: 2 mg via INTRAVENOUS

## 2014-03-02 SURGICAL SUPPLY — 54 items
APPLICATOR DR MATTHEWS STRL (MISCELLANEOUS) ×3 IMPLANT
BENZOIN TINCTURE PRP APPL 2/3 (GAUZE/BANDAGES/DRESSINGS) ×3 IMPLANT
BLADE SURG 15 STRL LF DISP TIS (BLADE) ×1 IMPLANT
BLADE SURG 15 STRL SS (BLADE) ×2
BLADE SURG ROTATE 9660 (MISCELLANEOUS) IMPLANT
CANISTER SUCTION 2500CC (MISCELLANEOUS) ×3 IMPLANT
CLEANER TIP ELECTROSURG 2X2 (MISCELLANEOUS) ×3 IMPLANT
CLOSURE WOUND 1/2 X4 (GAUZE/BANDAGES/DRESSINGS) ×1
COVER MAYO STAND STRL (DRAPES) ×3 IMPLANT
COVER SURGICAL LIGHT HANDLE (MISCELLANEOUS) ×3 IMPLANT
DECANTER SPIKE VIAL GLASS SM (MISCELLANEOUS) ×3 IMPLANT
ELECT COATED BLADE 2.86 ST (ELECTRODE) ×3 IMPLANT
ELECT NEEDLE BLADE 2-5/6 (NEEDLE) ×3 IMPLANT
ELECT NEEDLE TIP 2.8 STRL (NEEDLE) ×3 IMPLANT
ELECT REM PT RETURN 9FT ADLT (ELECTROSURGICAL) ×3
ELECTRODE REM PT RTRN 9FT ADLT (ELECTROSURGICAL) ×1 IMPLANT
GLOVE BIO SURGEON STRL SZ 6.5 (GLOVE) ×2 IMPLANT
GLOVE BIO SURGEONS STRL SZ 6.5 (GLOVE) ×1
GLOVE BIOGEL PI IND STRL 7.0 (GLOVE) ×1 IMPLANT
GLOVE BIOGEL PI INDICATOR 7.0 (GLOVE) ×2
GLOVE ECLIPSE 7.5 STRL STRAW (GLOVE) ×3 IMPLANT
GLOVE SURG SS PI 7.0 STRL IVOR (GLOVE) ×6 IMPLANT
GOWN STRL REUS W/ TWL LRG LVL3 (GOWN DISPOSABLE) ×2 IMPLANT
GOWN STRL REUS W/TWL LRG LVL3 (GOWN DISPOSABLE) ×4
IMPL BARRIER ORB 38X50X1.0 (Mesh General) ×1 IMPLANT
IMPLANT BARRIER ORB 38X50X1.0 (Mesh General) ×3 IMPLANT
KIT BASIN OR (CUSTOM PROCEDURE TRAY) ×3 IMPLANT
KIT ROOM TURNOVER OR (KITS) ×3 IMPLANT
MESH ORBITAL FLOOR (Mesh Specialty) IMPLANT
MESH ORBITAL FLOOR 55X55X.05 (Mesh Specialty) IMPLANT
NEEDLE 27GAX1X1/2 (NEEDLE) ×3 IMPLANT
NEEDLE HYPO 30X.5 LL (NEEDLE) ×3 IMPLANT
NS IRRIG 1000ML POUR BTL (IV SOLUTION) ×3 IMPLANT
PAD ARMBOARD 7.5X6 YLW CONV (MISCELLANEOUS) ×6 IMPLANT
PENCIL BUTTON HOLSTER BLD 10FT (ELECTRODE) ×3 IMPLANT
PROTECTOR CORNEAL (OPHTHALMIC RELATED) ×3 IMPLANT
SCISSORS WIRE ANG 4 3/4 DISP (INSTRUMENTS) ×3 IMPLANT
STAPLER VISISTAT 35W (STAPLE) ×3 IMPLANT
STRIP CLOSURE SKIN 1/2X4 (GAUZE/BANDAGES/DRESSINGS) ×2 IMPLANT
SUT ETHILON 4 0 CL P 3 (SUTURE) IMPLANT
SUT PLAIN 5 0 P 3 18 (SUTURE) ×3 IMPLANT
SUT PROLENE 6 0 PC 1 (SUTURE) IMPLANT
SUT SILK 4 0 P 3 (SUTURE) ×3 IMPLANT
SUT STEEL 0 (SUTURE)
SUT STEEL 0 18XMFL TIE 17 (SUTURE) IMPLANT
SUT STEEL 2 (SUTURE) IMPLANT
SUT VIC AB 5-0 PC1 18 (SUTURE) ×3 IMPLANT
SUT VIC AB 5-0 RB1 27 (SUTURE) ×6 IMPLANT
SUT VICRYL 4-0 PS2 18IN ABS (SUTURE) IMPLANT
TOWEL OR 17X24 6PK STRL BLUE (TOWEL DISPOSABLE) ×6 IMPLANT
TOWEL OR 17X26 10 PK STRL BLUE (TOWEL DISPOSABLE) ×3 IMPLANT
TRAY ENT MC OR (CUSTOM PROCEDURE TRAY) ×3 IMPLANT
TRAY FOLEY CATH 14FRSI W/METER (CATHETERS) IMPLANT
WATER STERILE IRR 1000ML POUR (IV SOLUTION) ×3 IMPLANT

## 2014-03-02 NOTE — Progress Notes (Signed)
ANTICOAGULATION CONSULT NOTE - Follow Up Consult  Pharmacy Consult for Heparin  Indication: pulmonary embolus  No Known Allergies  Patient Measurements: Height: 5\' 7"  (170.2 cm) Weight: 171 lb (77.565 kg) IBW/kg (Calculated) : 66.1  Vital Signs: Temp: 98.2 F (36.8 C) (03/19 2221) Temp src: Oral (03/19 2221) BP: 141/93 mmHg (03/19 2221) Pulse Rate: 72 (03/19 2221)  Labs:  Recent Labs  02/27/14 0554 03/01/14 1210 03/01/14 2035 03/02/14 0345  HGB 11.5*  --   --  10.6*  HCT 33.2*  --   --  31.1*  PLT 231  --   --  217  APTT  --  28  --   --   LABPROT  --  13.1  --   --   INR  --  1.01  --   --   HEPARINUNFRC  --   --  0.23* 0.56  CREATININE 1.13 0.76  --   --     Estimated Creatinine Clearance: 98.7 ml/min (by C-G formula based on Cr of 0.76).   Medications:  Heparin 1150 units/hr  Assessment: 53 y/o M on heparin for PE. HL is 0.56 after rate increase. Other labs as above.   Goal of Therapy:  Heparin level 0.3-0.7 units/ml Monitor platelets by anticoagulation protocol: Yes   Plan:  -Continue heparin at 1150 units/hr -1100 HL to confirm -Daily CBC/HL -Monitor for bleeding  Narda Bonds 03/02/2014,5:02 AM

## 2014-03-02 NOTE — Anesthesia Postprocedure Evaluation (Signed)
Anesthesia Post Note  Patient: Nicholas Ayala  Procedure(s) Performed: Procedure(s) (LRB): OPEN TREATMENT LEFT ORBITAL FLOOR WITH IMPLANT  (Left)  Anesthesia type: General  Patient location: PACU  Post pain: Pain level controlled  Post assessment: Patient's Cardiovascular Status Stable  Last Vitals:  Filed Vitals:   03/02/14 1315  BP: 155/94  Pulse: 86  Temp:   Resp: 17    Post vital signs: Reviewed and stable  Level of consciousness: alert  Complications: No apparent anesthesia complications

## 2014-03-02 NOTE — H&P (View-Only) (Signed)
PTD #3 ADDENDUM 1503 03/01/14 Pt and wife called with additional questions and concerns. Discussed no surgery is option , but diplopia may be permanent in that case. Pt asked that I call his PCP through [facility] Dr. Geronimo Running [phone] [place] office. Spoke to Environmental consultant; doctor out for rest of day. Provided my contact information. Pt and wife will call again with questions or if they do not want to proceed with surgery at this time.   Pt had emesis with foods, tolerating liquids, no nausea C/o double vision worse when looking to left Starting on heparin gtt  PE Regular Clear EOMI Occlusion normal, normal mouth opening  A/P Continued diplopia in setting of orbital floor fracture on left. Will plan open treatment. Discussed tranconjunctival vs lower lid incision, use of permanent prosthetic material, expected bruising numbness of cheeks. Also discussed that blindness from significant bleeding is a risk, esp in setting of blood thinner use. Will plan hold heparin 6 hrs prior. This was discussed with trauma and orders written. Spoke on phone with wife to discuss plan.  OR am 03/02/14  Irene Limbo, MD [facility] Plastic & Reconstructive Surgery [phone]

## 2014-03-02 NOTE — Interval H&P Note (Signed)
History and Physical Interval Note:  03/02/2014 10:46 AM  Nicholas Ayala  has presented today for surgery, with the diagnosis of LEFT ORBITAL FLOOR BLOWOUT   The various methods of treatment have been discussed with the patient and family. After consideration of risks, benefits and other options for treatment, the patient has consented to  Procedure(s): OPEN TREATMENT LEFT ORBITAL FLOOR WITH IMPLANT  (Left) as a surgical intervention .  The patient's history has been reviewed, patient examined, no change in status, stable for surgery.  I have reviewed the patient's chart and labs.  Questions were answered to the patient's satisfaction.     [name]

## 2014-03-02 NOTE — Progress Notes (Signed)
PT Cancellation Note  Patient Details Name: Nicholas Ayala MRN: [id number] DOB: 1961/04/13   Cancelled Treatment:     Patient in the OR for surgery today, Will hold PT   Duncan Dull 03/02/2014, 1:01 PM Alben Deeds, [place] DPT  [phone]

## 2014-03-02 NOTE — Preoperative (Signed)
Beta Blockers   Reason not to administer Beta Blockers:Not Applicable 

## 2014-03-02 NOTE — Progress Notes (Signed)
Spoke with Sharl Ma, CRNA about putting on sacral pad. They will apply it in the OR due to pelvic fractures.

## 2014-03-02 NOTE — Anesthesia Preprocedure Evaluation (Addendum)
Anesthesia Evaluation  Patient identified by MRN, date of birth, ID band Patient awake    Reviewed: Allergy & Precautions, H&P , NPO status , Patient's Chart, lab work & pertinent test results  Airway Mallampati: II TM Distance: >3 FB   Mouth opening: Limited Mouth Opening  Dental  (+) Teeth Intact, Poor Dentition, Loose, Dental Advisory Given   Pulmonary neg pulmonary ROS, Current Smoker,  breath sounds clear to auscultation        Cardiovascular + Peripheral Vascular Disease Rhythm:Regular Rate:Normal     Neuro/Psych    GI/Hepatic negative GI ROS, Neg liver ROS,   Endo/Other  negative endocrine ROS  Renal/GU negative Renal ROS     Musculoskeletal   Abdominal   Peds  Hematology  (+) anemia ,   Anesthesia Other Findings   Reproductive/Obstetrics                          Anesthesia Physical Anesthesia Plan  ASA: III  Anesthesia Plan: General   Post-op Pain Management:    Induction: Intravenous  Airway Management Planned: Oral ETT  Additional Equipment:   Intra-op Plan:   Post-operative Plan: Extubation in OR  Informed Consent:   Dental advisory given  Plan Discussed with: CRNA, Anesthesiologist and Surgeon  Anesthesia Plan Comments: (Multiple loose teeth bottom front and top front.)       Anesthesia Quick Evaluation

## 2014-03-02 NOTE — Anesthesia Procedure Notes (Addendum)
Performed by: Carney Living   Procedure Name: Intubation Date/Time: 03/02/2014 11:19 AM Performed by: Carney Living Pre-anesthesia Checklist: Patient identified, Emergency Drugs available, Suction available, Patient being monitored and Timeout performed Patient Re-evaluated:Patient Re-evaluated prior to inductionOxygen Delivery Method: Circle system utilized Preoxygenation: Pre-oxygenation with 100% oxygen Intubation Type: IV induction Ventilation: Mask ventilation without difficulty Laryngoscope Size: Mac and 4 Grade View: Grade I Tube type: Oral Tube size: 7.5 mm Number of attempts: 1 Airway Equipment and Method: Stylet Placement Confirmation: ETT inserted through vocal cords under direct vision,  positive ETCO2 and breath sounds checked- equal and bilateral Secured at: 22 cm Tube secured with: Tape Dental Injury: Teeth and Oropharynx as per pre-operative assessment

## 2014-03-02 NOTE — OR Nursing (Signed)
Eyelashes trimmed @1131  by Dr. Ashley Mariner.

## 2014-03-02 NOTE — Progress Notes (Signed)
In surgery today for blowout fracture. Will get PE study postoperatively before restarting Heparin.  This patient has been seen and I agree with the findings and treatment plan.  [name]. Dahlia Bailiff, MD, [place] [phone] (pager) [phone] (direct pager) Trauma Surgeon
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
## 2014-03-02 NOTE — Progress Notes (Signed)
Consents signed.  MD paged to update.  Will continue to monitor. Syliva Overman

## 2014-03-02 NOTE — Transfer of Care (Signed)
Immediate Anesthesia Transfer of Care Note  Patient: Nicholas Ayala  Procedure(s) Performed: Procedure(s): OPEN TREATMENT LEFT ORBITAL FLOOR WITH IMPLANT  (Left)  Patient Location: PACU  Anesthesia Type:General  Level of Consciousness: awake, alert , oriented and patient cooperative  Airway & Oxygen Therapy: Patient Spontanous Breathing and Patient connected to nasal cannula oxygen  Post-op Assessment: Report given to PACU RN, Post -op Vital signs reviewed and stable and Patient moving all extremities X 4  Post vital signs: Reviewed and stable  Complications: No apparent anesthesia complications

## 2014-03-02 NOTE — Op Note (Signed)
Operative Note   DATE OF OPERATION: 03/02/14  LOCATION: [facility] Main OR- Inpatient  SURGICAL DIVISION: Plastic Surgery  PREOPERATIVE DIAGNOSES:  Left orbital floor blowout fracture, closed  POSTOPERATIVE DIAGNOSES:  same  PROCEDURE:  Open treatment left orbital floor blowout with implant,subtarsal approach  SURGEON: Irene Limbo MD [facility]  ASSISTANT: none  ANESTHESIA:  General.   EBL: 75 ml  COMPLICATIONS: None.   INDICATIONS FOR PROCEDURE:  The patient, Nicholas Ayala, is a 53 y.o. male born on November 20, 1961, is here for treatment of left orbital floor blowout fracture. Patient presents with continued diplopia. His additional finding include pulmonary embolism and his anticoagulation has been held in anticipation of procedure. The risk of blindness with uncontrolled bleeding post operatively has been discussed with patient and family.   FINDINGS: No entrapment post placement of implant. Gross vision intact on exam in PACU without diplopia.  DESCRIPTION OF PROCEDURE:  The patient's operative site was marked with the patient in the preoperative area. The patient was taken to the operating room. SCDs were placed and IV antibiotics were given. Right eye was protected with tape and left eye protected with ophthalmic lubricant and scleral shield. The patient's operative site was prepped and draped in a sterile fashion. A time out was performed and all information was confirmed to be correct. 4-0 silk suture placed in lower eyelid as Frost stitch. Subtarsal incision designed and infiltrated with local anesthetic. Additional local infused to perform infraorbital n block. Incision made sharply at lateral extent and scissors used to separate orbicularis muscle in direction of fibers. Submuscular dissection completed with scissors toward medial canthus. Skin muscle flap divided with scissors taking care to preserve layer of orbicularis muscle over tarsal plate. Preseptal dissection completed to inferior orbital  rim and periosteum incised with cautery. Subperiosteal dissection completed along orbital floor to identify all margins of floor defect. Wound irrigated. Template fashioned of floor defect and used to contour Medpor orbital floor plate. This was placed over defect. Scleral shield was removed and forced duction test revealed no entrapment. The shield was replaced as closure was completed with 5-0 vicryl in orbicularis and running 5-0 fast gut for skin closure. The shield was removed and eye irrigated with BSS. Tobradex ophthalmic applied in left eye. Frost stitch secured to forehead with benzoin and steri strips.  The patient was allowed to wake from anesthesia, extubated and taken to the recovery room in satisfactory condition.   SPECIMENS: none  DRAINS: none  Irene Limbo, MD [facility] Plastic & Reconstructive Surgery [phone]

## 2014-03-03 DIAGNOSIS — I10 Essential (primary) hypertension: Secondary | ICD-10-CM

## 2014-03-03 LAB — CBC
HEMATOCRIT: 30.8 % — AB (ref 39.0–52.0)
Hemoglobin: 10.6 g/dL — ABNORMAL LOW (ref 13.0–17.0)
MCH: 31.7 pg (ref 26.0–34.0)
MCHC: 34.4 g/dL (ref 30.0–36.0)
MCV: 92.2 fL (ref 78.0–100.0)
Platelets: 220 10*3/uL (ref 150–400)
RBC: 3.34 MIL/uL — AB (ref 4.22–5.81)
RDW: 13.2 % (ref 11.5–15.5)
WBC: 4.9 10*3/uL (ref 4.0–10.5)

## 2014-03-03 LAB — HEPARIN LEVEL (UNFRACTIONATED): Heparin Unfractionated: <0.1 IU/mL — ABNORMAL LOW (ref 0.30–0.70)

## 2014-03-03 MED ORDER — METOPROLOL TARTRATE 12.5 MG HALF TABLET
12.5000 mg | ORAL_TABLET | Freq: Two times a day (BID) | ORAL | Status: DC
  Administered 2014-03-03 – 2014-03-04 (×4): 12.5 mg via ORAL
  Filled 2014-03-03 (×4): qty 1

## 2014-03-03 MED ORDER — HEPARIN (PORCINE) IN NACL 100-0.45 UNIT/ML-% IJ SOLN
1050.0000 [IU]/h | INTRAMUSCULAR | Status: DC
  Administered 2014-03-03 – 2014-03-04 (×2): 1150 [IU]/h via INTRAVENOUS
  Filled 2014-03-03 (×4): qty 250

## 2014-03-03 NOTE — Progress Notes (Addendum)
POD# 1 open treatment orbital floor left with implant  Heparin gtt not resumed- order for resume gtt no bolus to start at 0100 Pt without c/o, denies double vision   Temp:  [98.4 F (36.9 C)-101.1 F (38.4 C)] 98.4 F (36.9 C) (03/21 0532) Pulse Rate:  [61-88] 61 (03/21 0532) Resp:  [12-20] 20 (03/21 0532) BP: (125-163)/(78-104) 125/78 mmHg (03/21 0532) SpO2:  [94 %-100 %] 96 % (03/21 0532)  Expected edema, EOMI Gross vision intact Incision intact  A/P CT orbit with reduced orbital contents on left (Medpor plate is not radiopaque) Diplopia resolved per pt Resume heparin gtt Keep head elevated and continue tobramycin for 5 days- will have blurry vision while this is being used Frost stitch removed Lopressor IV started post op scheduled as patient was significantly hypertensive preop- was on no BP meds prior to admission. May titrate this down as able.   Irene Limbo, MD [facility] Plastic & Reconstructive Surgery [phone]  ADDENDUM: Review of chart all heparin orders to resume were discontinued by staff on arrival to floor. Start gtt this am no bolus.
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
## 2014-03-03 NOTE — Progress Notes (Signed)
Physical Therapy Treatment Patient Details Name: MARISA HUFSTETLER MRN: [id number] DOB: 1960/12/17 Today's Date: 03/03/2014 Time: 8299-3716 PT Time Calculation (min): 19 min  PT Assessment / Plan / Recommendation  History of Present Illness     PT Comments   Mobility limited to transfers per MD order WBAT RLE.  Follow Up Recommendations  CIR;Supervision for mobility/OOB     Does the patient have the potential to tolerate intense rehabilitation     Barriers to Discharge        Equipment Recommendations  Rolling walker with 5" wheels;Wheelchair (measurements PT);Wheelchair cushion (measurements PT)    Recommendations for Other Services Rehab consult  Frequency Min 4X/week   Progress towards PT Goals Progress towards PT goals: Progressing toward goals  Plan Current plan remains appropriate    Precautions / Restrictions Precautions Precautions: Fall Restrictions RLE Weight Bearing: Weight bearing as tolerated LLE Weight Bearing: Non weight bearing   Pertinent Vitals/Pain     Mobility  Bed Mobility Supine to sit: Min assist Transfers Equipment used: None Stand pivot transfers: Min assist General transfer comment: verbal cues for sequencing/hand placement.  Pt transfered bed-->BSC-->recliner toward right side    Exercises     PT Diagnosis:    PT Problem List:   PT Treatment Interventions:     PT Goals (current goals can now be found in the care plan section)    Visit Information  Last PT Received On: 03/03/14 Assistance Needed: +1    Subjective Data      Cognition  Cognition Arousal/Alertness: Awake/alert Behavior During Therapy: WFL for tasks assessed/performed Overall Cognitive Status: Within Functional Limits for tasks assessed    Balance     End of Session PT - End of Session Equipment Utilized During Treatment: Gait belt Activity Tolerance: Patient tolerated treatment well Patient left: in chair;with call bell/phone within reach Nurse  Communication: Mobility status   GP     Lorriane Shire 03/03/2014, 2:21 PM  Lorrin Goodell, PT  Office # [phone] Pager [phone]

## 2014-03-03 NOTE — Progress Notes (Signed)
ANTICOAGULATION CONSULT NOTE - Follow Up Consult  Pharmacy Consult for Heparin  Indication: pulmonary embolus  No Known Allergies  Patient Measurements: Height: 5\' 7"  (170.2 cm) Weight: 171 lb (77.565 kg) IBW/kg (Calculated) : 66.1  Vital Signs: Temp: 98.4 F (36.9 C) (03/21 0532) Temp src: Oral (03/21 0532) BP: 125/78 mmHg (03/21 0532) Pulse Rate: 61 (03/21 0532)  Labs:  Recent Labs  03/01/14 1210 03/01/14 2035 03/02/14 0345 03/03/14 0433  HGB  --   --  10.6* 10.6*  HCT  --   --  31.1* 30.8*  PLT  --   --  217 220  APTT 28  --   --   --   LABPROT 13.1  --   --   --   INR 1.01  --   --   --   HEPARINUNFRC  --  0.23* 0.56 <0.10*  CREATININE 0.76  --   --   --     Estimated Creatinine Clearance: 98.7 ml/min (by C-G formula based on Cr of 0.76).  Medications:  Heparin resuming  Assessment: 69 YOM to resume heparin after surgery for PE. From chart review, heparin was held at 0500 on 3/20 for ORIF. Noted from 3/20 trauma note a PE post-op study was to be done before restarting heparin with plans to start heparin 12-24 hours after surgery. Original heparin per pharmacy consult was discontinued 3/20 prior to surgery and new consult was not obtained until this morning at ~0930. No drip orders were entered prior to receiving consults this morning. Patient was previously therapeutic on 1150 units/hr. Hgb remains low but stable post-op at 10.6, platelets normal at 220. No overt bleeding noted.  Goal of Therapy:  Heparin level 0.3-0.7 units/ml Monitor platelets by anticoagulation protocol: Yes   Plan:  1. Resume heparin at 1150 units/hr with NO BOLUS per MD request 2. 6 hour HL 3. Daily CBC/HL 4. Monitor for bleeding  Jaythen Hamme D. Bailley [place], PharmD, BCPS Clinical Pharmacist Pager: [phone] 03/03/2014 9:40 AM
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
## 2014-03-04 LAB — CBC
HEMATOCRIT: 31.7 % — AB (ref 39.0–52.0)
HEMOGLOBIN: 11 g/dL — AB (ref 13.0–17.0)
MCH: 31.8 pg (ref 26.0–34.0)
MCHC: 34.7 g/dL (ref 30.0–36.0)
MCV: 91.6 fL (ref 78.0–100.0)
Platelets: 252 10*3/uL (ref 150–400)
RBC: 3.46 MIL/uL — ABNORMAL LOW (ref 4.22–5.81)
RDW: 13.1 % (ref 11.5–15.5)
WBC: 5.4 10*3/uL (ref 4.0–10.5)

## 2014-03-04 LAB — HEPARIN LEVEL (UNFRACTIONATED)
Heparin Unfractionated: 0.51 IU/mL (ref 0.30–0.70)
Heparin Unfractionated: 0.7 [IU]/mL (ref 0.30–0.70)

## 2014-03-04 NOTE — Progress Notes (Signed)
ANTICOAGULATION CONSULT NOTE - Follow Up Consult  Pharmacy Consult for Heparin  Indication: pulmonary embolus  No Known Allergies  Patient Measurements: Height: 5\' 7"  (170.2 cm) Weight: 171 lb (77.565 kg) IBW/kg (Calculated) : 66.1  Vital Signs: Temp: 98.9 F (37.2 C) (03/21 2150) Temp src: Oral (03/21 2150) BP: 155/88 mmHg (03/21 2150) Pulse Rate: 67 (03/21 2150)  Labs:  Recent Labs  03/01/14 1210  03/02/14 0345 03/03/14 0433 03/03/14 2359  HGB  --   --  10.6* 10.6*  --   HCT  --   --  31.1* 30.8*  --   PLT  --   --  217 220  --   APTT 28  --   --   --   --   LABPROT 13.1  --   --   --   --   INR 1.01  --   --   --   --   HEPARINUNFRC  --   < > 0.56 <0.10* 0.51  CREATININE 0.76  --   --   --   --   < > = values in this interval not displayed.  Estimated Creatinine Clearance: 98.7 ml/min (by C-G formula based on Cr of 0.76).   Medications:  Heparin 1150 units/hr  Assessment: 53 y/o M on heparin for PE. HL is 0.51 after resuming post-op with no bolus. Other labs as above.   Goal of Therapy:  Heparin level 0.3-0.7 units/ml Monitor platelets by anticoagulation protocol: Yes   Plan:  -Continue heparin at 1150 units/hr -1000 HL to confirm -Daily CBC/HL -Monitor for bleeding  Narda Bonds 03/04/2014,2:17 AM

## 2014-03-04 NOTE — Progress Notes (Signed)
Seen, agree with above.   Anticoagulate for PEs. Doing well.

## 2014-03-04 NOTE — Progress Notes (Signed)
ANTICOAGULATION CONSULT NOTE - Follow Up Consult  Pharmacy Consult for Heparin  Indication: pulmonary embolus  No Known Allergies  Patient Measurements: Height: 5\' 7"  (170.2 cm) Weight: 171 lb (77.565 kg) IBW/kg (Calculated) : 66.1  Vital Signs: Temp: 98.4 F (36.9 C) (03/22 0545) Temp src: Oral (03/22 0545) BP: 137/93 mmHg (03/22 0545) Pulse Rate: 62 (03/22 0545)  Labs:  Recent Labs  03/02/14 0345 03/03/14 0433 03/03/14 2359 03/04/14 1150  HGB 10.6* 10.6*  --  11.0*  HCT 31.1* 30.8*  --  31.7*  PLT 217 220  --  252  HEPARINUNFRC 0.56 <0.10* 0.51 0.70    Estimated Creatinine Clearance: 98.7 ml/min (by C-G formula based on Cr of 0.76).   Medications:  Heparin @ 1150 units/hr  Assessment: 53 y/o M on heparin for PE. HL was 0.51 after resuming post-op with no bolus. A confirmatory level checked this morning is in therapeutic range but on higher end at 0.7 units/mL. CBC is stable and no bleeding noted. Planning to switch to PO Xarelto at some point, but need guidance from Plastics on timing.  Goal of Therapy:  Heparin level 0.3-0.7 units/ml Monitor platelets by anticoagulation protocol: Yes   Plan:  1. Decrease heparin to 1050 units/hr 2. Next HL with AM labs as currently therapeutic 3. Daily HL and CBC 4. Follow for long term AC plans and for s/s bleeding  Bryley Kovacevic D. Jax Kentner, PharmD, BCPS Clinical Pharmacist Pager: [phone] 03/04/2014 1:52 PM

## 2014-03-04 NOTE — Progress Notes (Signed)
Weekend CSW left voicemail for [place] [facility] [place] inquiring about patient's benefit status. Awaiting return call. Weekday CSW will continue to follow to assist with discharge planning when medically stable.  Tilden Fossa, MSW, [place] Clinical Social Worker [facility] [phone]
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
## 2014-03-05 ENCOUNTER — Inpatient Hospital Stay (HOSPITAL_COMMUNITY): Payer: Non-veteran care | Admitting: Anesthesiology

## 2014-03-05 ENCOUNTER — Encounter (HOSPITAL_COMMUNITY): Admission: EM | Disposition: A | Discharge status: Home Health | Payer: Self-pay | Source: Home / Self Care

## 2014-03-05 ENCOUNTER — Encounter (HOSPITAL_COMMUNITY): Payer: Non-veteran care | Admitting: Anesthesiology

## 2014-03-05 ENCOUNTER — Inpatient Hospital Stay (HOSPITAL_COMMUNITY): Payer: Non-veteran care

## 2014-03-05 ENCOUNTER — Encounter (HOSPITAL_COMMUNITY): Payer: Self-pay | Admitting: Anesthesiology

## 2014-03-05 HISTORY — PX: HEMATOMA EVACUATION: SHX5118

## 2014-03-05 LAB — CBC
HEMATOCRIT: 31 % — AB (ref 39.0–52.0)
HEMOGLOBIN: 10.8 g/dL — AB (ref 13.0–17.0)
MCH: 31.6 pg (ref 26.0–34.0)
MCHC: 34.8 g/dL (ref 30.0–36.0)
MCV: 90.6 fL (ref 78.0–100.0)
Platelets: 282 10*3/uL (ref 150–400)
RBC: 3.42 MIL/uL — ABNORMAL LOW (ref 4.22–5.81)
RDW: 12.8 % (ref 11.5–15.5)
WBC: 7.2 10*3/uL (ref 4.0–10.5)

## 2014-03-05 LAB — HEPARIN LEVEL (UNFRACTIONATED): HEPARIN UNFRACTIONATED: 0.43 [IU]/mL (ref 0.30–0.70)

## 2014-03-05 SURGERY — EVACUATION HEMATOMA
Anesthesia: General | Site: Eye | Laterality: Left

## 2014-03-05 MED ORDER — HEPARIN (PORCINE) IN NACL 100-0.45 UNIT/ML-% IJ SOLN
1050.0000 [IU]/h | INTRAMUSCULAR | Status: DC
  Administered 2014-03-05 – 2014-03-06 (×2): 1050 [IU]/h via INTRAVENOUS
  Filled 2014-03-05 (×5): qty 250

## 2014-03-05 MED ORDER — CEFAZOLIN SODIUM-DEXTROSE 2-3 GM-% IV SOLR
INTRAVENOUS | Status: DC | PRN
  Administered 2014-03-05: 2 g via INTRAVENOUS

## 2014-03-05 MED ORDER — EPHEDRINE SULFATE 50 MG/ML IJ SOLN
INTRAMUSCULAR | Status: DC | PRN
  Administered 2014-03-05: 10 mg via INTRAVENOUS

## 2014-03-05 MED ORDER — GLYCOPYRROLATE 0.2 MG/ML IJ SOLN
INTRAMUSCULAR | Status: DC | PRN
  Administered 2014-03-05: 0.6 mg via INTRAVENOUS

## 2014-03-05 MED ORDER — OXYCODONE HCL 5 MG PO TABS: 5.0000 mg | ORAL_TABLET | Freq: Once | ORAL | Status: DC | PRN

## 2014-03-05 MED ORDER — ONDANSETRON HCL 4 MG/2ML IJ SOLN: 4.0000 mg | Freq: Once | INTRAMUSCULAR | Status: DC | PRN

## 2014-03-05 MED ORDER — MIDAZOLAM HCL 5 MG/5ML IJ SOLN
INTRAMUSCULAR | Status: DC | PRN
  Administered 2014-03-05: 2 mg via INTRAVENOUS

## 2014-03-05 MED ORDER — FENTANYL CITRATE 0.05 MG/ML IJ SOLN
INTRAMUSCULAR | Status: AC
  Filled 2014-03-05: qty 5

## 2014-03-05 MED ORDER — FENTANYL CITRATE 0.05 MG/ML IJ SOLN
INTRAMUSCULAR | Status: DC | PRN
  Administered 2014-03-05: 150 ug via INTRAVENOUS
  Administered 2014-03-05: 100 ug via INTRAVENOUS

## 2014-03-05 MED ORDER — SODIUM CHLORIDE 0.9 % IV SOLN
INTRAVENOUS | Status: DC | PRN
  Administered 2014-03-05: 03:00:00 via INTRAVENOUS

## 2014-03-05 MED ORDER — CEFAZOLIN SODIUM 1-5 GM-% IV SOLN
1.0000 g | Freq: Three times a day (TID) | INTRAVENOUS | Status: AC
  Administered 2014-03-05 – 2014-03-06 (×5): 1 g via INTRAVENOUS
  Filled 2014-03-05 (×7): qty 50

## 2014-03-05 MED ORDER — PROPOFOL 10 MG/ML IV BOLUS
INTRAVENOUS | Status: DC | PRN
  Administered 2014-03-05: 200 mg via INTRAVENOUS

## 2014-03-05 MED ORDER — DEXAMETHASONE SODIUM PHOSPHATE 10 MG/ML IJ SOLN
INTRAMUSCULAR | Status: DC | PRN
  Administered 2014-03-05: 10 mg via INTRAVENOUS

## 2014-03-05 MED ORDER — EYE WASH OPHTH SOLN
1.0000 [drp] | Freq: Once | OPHTHALMIC | Status: AC
  Administered 2014-03-05: 1 [drp] via OPHTHALMIC
  Filled 2014-03-05: qty 118

## 2014-03-05 MED ORDER — DEXTROSE 5 % IV SOLN
INTRAVENOUS | Status: DC | PRN
  Administered 2014-03-05: 03:00:00 via INTRAVENOUS

## 2014-03-05 MED ORDER — BSS IO SOLN
INTRAOCULAR | Status: AC
  Filled 2014-03-05: qty 30

## 2014-03-05 MED ORDER — OXYCODONE HCL 5 MG/5ML PO SOLN: 5.0000 mg | Freq: Once | ORAL | Status: DC | PRN

## 2014-03-05 MED ORDER — HYDROMORPHONE HCL PF 1 MG/ML IJ SOLN
0.5000 mg | INTRAMUSCULAR | Status: DC | PRN
  Administered 2014-03-08: 0.5 mg via INTRAVENOUS
  Administered 2014-03-09: 1 mg via INTRAVENOUS
  Filled 2014-03-05 (×2): qty 1

## 2014-03-05 MED ORDER — 0.9 % SODIUM CHLORIDE (POUR BTL) OPTIME
TOPICAL | Status: DC | PRN
  Administered 2014-03-05: 50 mL

## 2014-03-05 MED ORDER — ARTIFICIAL TEARS OP OINT
TOPICAL_OINTMENT | OPHTHALMIC | Status: DC | PRN
  Administered 2014-03-05: 1 via OPHTHALMIC

## 2014-03-05 MED ORDER — CYCLOPENTOLATE HCL 1 % OP SOLN
1.0000 [drp] | Freq: Once | OPHTHALMIC | Status: AC
  Administered 2014-03-06: 1 [drp] via OPHTHALMIC
  Filled 2014-03-05: qty 2

## 2014-03-05 MED ORDER — BSS IO SOLN
INTRAOCULAR | Status: DC | PRN
  Administered 2014-03-05: 15 mL via INTRAOCULAR

## 2014-03-05 MED ORDER — MEPERIDINE HCL 25 MG/ML IJ SOLN
6.2500 mg | INTRAMUSCULAR | Status: DC | PRN
Start: 2014-03-05 — End: 2014-03-05

## 2014-03-05 MED ORDER — NEOSTIGMINE METHYLSULFATE 1 MG/ML IJ SOLN
INTRAMUSCULAR | Status: DC | PRN
  Administered 2014-03-05: 5 mg via INTRAVENOUS

## 2014-03-05 MED ORDER — MIDAZOLAM HCL 2 MG/2ML IJ SOLN
INTRAMUSCULAR | Status: AC
  Filled 2014-03-05: qty 2

## 2014-03-05 MED ORDER — CEFAZOLIN SODIUM-DEXTROSE 2-3 GM-% IV SOLR
INTRAVENOUS | Status: AC
  Filled 2014-03-05: qty 50

## 2014-03-05 MED ORDER — NEOSTIGMINE METHYLSULFATE 1 MG/ML IJ SOLN
INTRAMUSCULAR | Status: AC
  Filled 2014-03-05: qty 10

## 2014-03-05 MED ORDER — LABETALOL HCL 5 MG/ML IV SOLN
INTRAVENOUS | Status: DC | PRN
  Administered 2014-03-05: 10 mg via INTRAVENOUS

## 2014-03-05 MED ORDER — SUCCINYLCHOLINE CHLORIDE 20 MG/ML IJ SOLN
INTRAMUSCULAR | Status: DC | PRN
  Administered 2014-03-05: 140 mg via INTRAVENOUS

## 2014-03-05 MED ORDER — METOPROLOL TARTRATE 1 MG/ML IV SOLN
5.0000 mg | Freq: Four times a day (QID) | INTRAVENOUS | Status: DC
  Administered 2014-03-05 – 2014-03-09 (×8): 5 mg via INTRAVENOUS
  Filled 2014-03-05 (×19): qty 5

## 2014-03-05 MED ORDER — KETOROLAC TROMETHAMINE 0.5 % OP SOLN
1.0000 [drp] | Freq: Four times a day (QID) | OPHTHALMIC | Status: DC
  Administered 2014-03-05 – 2014-03-12 (×28): 1 [drp] via OPHTHALMIC
  Filled 2014-03-05 (×2): qty 3

## 2014-03-05 MED ORDER — VECURONIUM BROMIDE 10 MG IV SOLR
INTRAVENOUS | Status: DC | PRN
  Administered 2014-03-05: 4 mg via INTRAVENOUS
  Administered 2014-03-05: 2 mg via INTRAVENOUS

## 2014-03-05 MED ORDER — ONDANSETRON HCL 4 MG/2ML IJ SOLN
INTRAMUSCULAR | Status: DC | PRN
  Administered 2014-03-05: 4 mg via INTRAVENOUS

## 2014-03-05 MED ORDER — IOHEXOL 350 MG/ML SOLN
100.0000 mL | Freq: Once | INTRAVENOUS | Status: AC | PRN
  Administered 2014-03-05: 100 mL via INTRAVENOUS

## 2014-03-05 MED ORDER — GLYCOPYRROLATE 0.2 MG/ML IJ SOLN
INTRAMUSCULAR | Status: AC
  Filled 2014-03-05: qty 3

## 2014-03-05 MED ORDER — LIDOCAINE HCL (CARDIAC) 20 MG/ML IV SOLN
INTRAVENOUS | Status: DC | PRN
  Administered 2014-03-05: 100 mg via INTRAVENOUS
  Administered 2014-03-05: 50 mg via INTRAVENOUS

## 2014-03-05 MED ORDER — LABETALOL HCL 5 MG/ML IV SOLN
INTRAVENOUS | Status: AC
  Filled 2014-03-05: qty 4

## 2014-03-05 MED ORDER — HYDROMORPHONE HCL PF 1 MG/ML IJ SOLN: 0.2500 mg | INTRAMUSCULAR | Status: DC | PRN

## 2014-03-05 MED ORDER — TOBRAMYCIN-DEXAMETHASONE 0.3-0.1 % OP OINT
TOPICAL_OINTMENT | OPHTHALMIC | Status: AC
  Filled 2014-03-05: qty 3.5

## 2014-03-05 SURGICAL SUPPLY — 35 items
BENZOIN TINCTURE PRP APPL 2/3 (GAUZE/BANDAGES/DRESSINGS) ×3 IMPLANT
BLADE SURG 15 STRL LF DISP TIS (BLADE) ×1 IMPLANT
BLADE SURG 15 STRL SS (BLADE) ×2
CLOSURE STERI-STRIP 1/2X4 (GAUZE/BANDAGES/DRESSINGS) ×1
CLSR STERI-STRIP ANTIMIC 1/2X4 (GAUZE/BANDAGES/DRESSINGS) ×2 IMPLANT
COVER LIGHT HANDLE STERIS (MISCELLANEOUS) ×3 IMPLANT
DRAPE ORTHO SPLIT 77X108 STRL (DRAPES) ×2
DRAPE SURG ORHT 6 SPLT 77X108 (DRAPES) ×1 IMPLANT
ELECT NEEDLE BLADE 2-5/6 (NEEDLE) ×3 IMPLANT
GAUZE SPONGE 4X4 16PLY XRAY LF (GAUZE/BANDAGES/DRESSINGS) ×3 IMPLANT
GLOVE BIO SURGEON STRL SZ 6 (GLOVE) ×3 IMPLANT
GLOVE BIO SURGEON STRL SZ8 (GLOVE) ×3 IMPLANT
GLOVE BIOGEL PI IND STRL 8 (GLOVE) ×1 IMPLANT
GLOVE BIOGEL PI INDICATOR 8 (GLOVE) ×2
GLOVE SURG SS PI 7.5 STRL IVOR (GLOVE) ×3 IMPLANT
GOWN STRL REUS W/ TWL LRG LVL3 (GOWN DISPOSABLE) ×1 IMPLANT
GOWN STRL REUS W/TWL LRG LVL3 (GOWN DISPOSABLE) ×2
GOWN STRL REUS W/TWL XL LVL4 (GOWN DISPOSABLE) ×3 IMPLANT
HEMOSTAT SURGICEL .5X2 ABSORB (HEMOSTASIS) ×3 IMPLANT
HEMOSTAT SURGICEL 2X14 (HEMOSTASIS) ×3 IMPLANT
KIT BASIN OR (CUSTOM PROCEDURE TRAY) ×3 IMPLANT
MATRIX HEMOSTAT SURGIFLO (HEMOSTASIS) ×3 IMPLANT
PACK VITRECTOMY CUSTOM (CUSTOM PROCEDURE TRAY) ×3 IMPLANT
PENCIL BUTTON HOLSTER BLD 10FT (ELECTRODE) ×3 IMPLANT
PROTECTOR CORNEAL (OPHTHALMIC RELATED) ×3 IMPLANT
SUCTION FRAZIER TIP 8 FR DISP (SUCTIONS) ×2
SUCTION TUBE FRAZIER 8FR DISP (SUCTIONS) ×1 IMPLANT
SUT PLAIN 5 0 P 3 18 (SUTURE) ×3 IMPLANT
SUT SILK 4 0 C 3 735G (SUTURE) ×6 IMPLANT
SUT VIC AB 5-0 P-3 18XBRD (SUTURE) ×1 IMPLANT
SUT VIC AB 5-0 P3 18 (SUTURE) ×2
SYR BULB 3OZ (MISCELLANEOUS) ×3 IMPLANT
TOWEL OR 17X26 10 PK STRL BLUE (TOWEL DISPOSABLE) ×3 IMPLANT
TUBE CONNECTING 12'X1/4 (SUCTIONS) ×1
TUBE CONNECTING 12X1/4 (SUCTIONS) ×2 IMPLANT

## 2014-03-05 NOTE — Progress Notes (Signed)
PT Cancellation Note  Patient Details Name: PRINCETON NABOR MRN: [id number] DOB: 01-30-1961   Cancelled Treatment:    Reason Eval/Treat Not Completed: Medical issues which prohibited therapy.  Spoke with nursing, patient with emergent surgery this am, patient not appropriate for mobility at this time.    Duncan Dull 03/05/2014, 11:50 AM Alben Deeds, PT DPT  [phone]

## 2014-03-05 NOTE — Anesthesia Preprocedure Evaluation (Signed)
Anesthesia Evaluation  Patient identified by MRN, date of birth, ID band Patient awake    Reviewed: Allergy & Precautions, H&P , NPO status , Patient's Chart, lab work & pertinent test results  Airway Mallampati: I TM Distance: >3 FB Neck ROM: Full    Dental   Pulmonary          Cardiovascular + Peripheral Vascular Disease     Neuro/Psych    GI/Hepatic   Endo/Other    Renal/GU      Musculoskeletal   Abdominal   Peds  Hematology   Anesthesia Other Findings   Reproductive/Obstetrics                           Anesthesia Physical Anesthesia Plan  ASA: III and emergent  Anesthesia Plan: General   Post-op Pain Management:    Induction: Intravenous  Airway Management Planned: Oral ETT  Additional Equipment:   Intra-op Plan:   Post-operative Plan: Extubation in OR  Informed Consent: I have reviewed the patients History and Physical, chart, labs and discussed the procedure including the risks, benefits and alternatives for the proposed anesthesia with the patient or authorized representative who has indicated his/her understanding and acceptance.     Plan Discussed with: CRNA and Surgeon  Anesthesia Plan Comments:         Anesthesia Quick Evaluation

## 2014-03-05 NOTE — Progress Notes (Signed)
We had ordered a CTA of the lungs on Friday because we felt as though if this study did not show evidence of a PE then we could avoid placing the patient on Heparin.  This is because the previous CT study had the PE as an incidental finding.  Will consider getting study today.  Either he will restart heparin or possible consider filter if heparin contraindicated.  This patient has been seen and I agree with the findings and treatment plan.  [name]. Dahlia Bailiff, MD, [place] [phone] (pager) [phone] (direct pager) Trauma Surgeon

## 2014-03-05 NOTE — Progress Notes (Signed)
ANTICOAGULATION CONSULT NOTE - Follow Up Consult  Pharmacy Consult for heparin Indication: pulmonary embolus  No Known Allergies  Patient Measurements: Height: 5\' 7"  (170.2 cm) Weight: 171 lb (77.565 kg) IBW/kg (Calculated) : 66.1 Heparin Dosing Weight: 77 kg  Vital Signs: Temp: 98.5 F (36.9 C) (03/23 1900) Temp src: Oral (03/23 1814) BP: 174/99 mmHg (03/23 1900) Pulse Rate: 77 (03/23 1900)  Labs:  Recent Labs  03/03/14 0433 03/03/14 2359 03/04/14 1150 03/05/14 0610 03/05/14 2208  HGB 10.6*  --  11.0* 10.8*  --   HCT 30.8*  --  31.7* 31.0*  --   PLT 220  --  252 282  --   HEPARINUNFRC <0.10* 0.51 0.70  --  0.43    Estimated Creatinine Clearance: 98.7 ml/min (by C-G formula based on Cr of 0.76).   Medications:  Scheduled:  . bicalutamide  50 mg Oral Daily  . calcium-vitamin D  1 tablet Oral BID  .  ceFAZolin (ANCEF) IV  1 g Intravenous 3 times per day  . citalopram  10 mg Oral Daily  . cyclopentolate  1 drop Left Eye Once  . ketorolac  1 drop Right Eye QID  . metoprolol  5 mg Intravenous 4 times per day  . pantoprazole  40 mg Oral Daily  . tobramycin-dexamethasone   Left Eye BID  . traZODone  50 mg Oral QHS   Infusions:  . dextrose 5 % and 0.9 % NaCl with KCl 20 mEq/L 50 mL/hr at 03/05/14 0813  . heparin 1,050 Units/hr (03/05/14 1900)    Assessment: 53 yo male with PE is currently on therapeutic heparin. Heparin level is 0.43.  Goal of Therapy:  Heparin level 0.3-0.7 units/ml Monitor platelets by anticoagulation protocol: Yes   Plan:  1) continue heparin at 1050 units/hr 2) Daily heparin level and CBC  Algie Cales, Tsz-Yin 03/05/2014,10:42 PM

## 2014-03-05 NOTE — Anesthesia Procedure Notes (Signed)
Procedure Name: Intubation Date/Time: 03/05/2014 2:58 AM Performed by: Jacquiline Doe A Pre-anesthesia Checklist: Patient identified, Timeout performed, Emergency Drugs available, Suction available and Patient being monitored Patient Re-evaluated:Patient Re-evaluated prior to inductionOxygen Delivery Method: Circle system utilized Preoxygenation: Pre-oxygenation with 100% oxygen Intubation Type: IV induction, Rapid sequence and Cricoid Pressure applied Laryngoscope Size: Mac and 4 Grade View: Grade I Tube type: Oral Number of attempts: 1 Airway Equipment and Method: Stylet Placement Confirmation: ETT inserted through vocal cords under direct vision,  breath sounds checked- equal and bilateral and positive ETCO2 Secured at: 23 cm Tube secured with: Tape Dental Injury: Teeth and Oropharynx as per pre-operative assessment
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
## 2014-03-05 NOTE — Progress Notes (Signed)
Report given to [facility] on 3S. Syliva Overman

## 2014-03-05 NOTE — Preoperative (Signed)
Beta Blockers   Reason not to administer Beta Blockers:Metoprolol 12.5 mg po @2138  on 03/04/2014

## 2014-03-05 NOTE — Transfer of Care (Signed)
Immediate Anesthesia Transfer of Care Note  Patient: Nicholas Ayala  Procedure(s) Performed: Procedure(s): EVACUATION HEMATOMA (Left)  Patient Location: PACU  Anesthesia Type:General  Level of Consciousness: oriented, sedated, patient cooperative and responds to stimulation  Airway & Oxygen Therapy: Patient Spontanous Breathing and Patient connected to nasal cannula oxygen  Post-op Assessment: Report given to PACU RN, Post -op Vital signs reviewed and stable, Patient moving all extremities and Patient moving all extremities X 4  Post vital signs: Reviewed and stable  Complications: No apparent anesthesia complications

## 2014-03-05 NOTE — Progress Notes (Signed)
Pt c/o pain that was unrelieved with medication. Pt stated pain 10/10 in spite of pain medication received. Upon assessment left orbital was swollen closed. Pt stated that he was able to see light from the left orbit during neuro assessment. Right pupil reacted sluggishly, pupil size 2. Unable to assess to left pupil due to swelling and excrutiating pain. Plastic surgeon Dr. Iran Planas and Dr.Byerly notified. Heparin drip stopped at 0125 and disconnected from pt. Pt transported CT and accompanied by RN. Report given to PACU nurse.

## 2014-03-05 NOTE — Progress Notes (Signed)
OT Cancellation Note  Patient Details Name: DYSHAUN BONZO MRN: [id number] DOB: [birth year]   Cancelled Treatment:    Reason Eval/Treat Not Completed: Medical issues which prohibited therapy (Surgery this am)  [place], OTR/L  [phone] 03/05/2014 03/05/2014, 1:41 PM

## 2014-03-05 NOTE — Anesthesia Postprocedure Evaluation (Signed)
Anesthesia Post Note  Patient: Nicholas Ayala  Procedure(s) Performed: Procedure(s) (LRB): EVACUATION HEMATOMA (Left)  Anesthesia type: general  Patient location: PACU  Post pain: Pain level controlled  Post assessment: Patient's Cardiovascular Status Stable  Last Vitals:  Filed Vitals:   03/05/14 0529  BP: 131/90  Pulse: 81  Temp: 36.4 C  Resp: 16    Post vital signs: Reviewed and stable  Level of consciousness: sedated  Complications: No apparent anesthesia complications
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
## 2014-03-05 NOTE — Op Note (Signed)
Operative Note   DATE OF OPERATION: 03/05/14  LOCATION: [facility] Main OR -inpatient  SURGICAL DIVISION: Plastic Surgery  PREOPERATIVE DIAGNOSES:  Hematoma left orbit, history left orbital floor fracture, pulmonary embolus  POSTOPERATIVE DIAGNOSES:  same  PROCEDURE:  1. Lateral canthotomy 2. Evacuation left orbit hematoma 2. Temporary medial tarsorrhaphy  SURGEON: Irene Limbo MD MBA  ASSISTANT: none  ANESTHESIA:  General.   EBL: 15 ml  COMPLICATIONS: None.   INDICATIONS FOR PROCEDURE:  The patient, Nicholas Ayala, is a 53 y.o. male born on Nov 13, 1961, is here for emergent treatment left orbit hematoma incurred while patient on anticoagulation for pulmonary embolus.   FINDINGS: Intraoperatively no active bleeding from orbit and periorbita. Large clots evacuated from orbit and maxillary sinus with active maxillary sinus bleeding noted. Following replacement of Medpor orbital floor plate, forced duction test negative.  DESCRIPTION OF PROCEDURE:  The patient was placed supine on operating table and left eye protected with scleral shield. The patient was prepped and draped in usual fashion. A time out was performed and all information was confirmed to be correct.  Previous subtarsal incision opened and extended into lower lid margin. Lower lid lateral canthal tendon divided sharply with scissors. Immediate evacuation of clot with resolution proptosis. Orbital floor Medpor plate removed and set aside. Maxillary sinus cavity as examined from orbit with active bleeding noted. Treated with cautery and packed with Surgicel. Wound reexamined after several minutes. Surgicel removed and cavity irrigated. Surgiflo hemostatic agent placed in sinus. Medpor plate replaced in orbital floor. Forced duction test following placement negative. Closure completed with 5-0 vicryl to approximate orbicularis oris and 5-0 plain gut for skin closure. Lateral canthotomy left in place. Due to amount of chemosis medially, elected  to place temporary tarsorrhaphy to allow coverage of conjunctiva. Eye irrigated with BSS and Tobradex applied. Frost stitch placed laterally and secured to forehead with steristrips.  The patient was allowed to wake from anesthesia, extubated and taken to the recovery room in satisfactory condition.   SPECIMENS: none  DRAINS: none   Patient will need Ophthalmology consult. Continue to hold anticoagulation.  Irene Limbo, MD [facility] Plastic & Reconstructive Surgery [phone]

## 2014-03-05 NOTE — Progress Notes (Signed)
ANTICOAGULATION CONSULT NOTE - Follow Up Consult  Pharmacy Consult for Heparin  Indication: pulmonary embolus  No Known Allergies  Patient Measurements: Height: 5\' 7"  (170.2 cm) Weight: 171 lb (77.565 kg) IBW/kg (Calculated) : 66.1  Vital Signs: Temp: 98 F (36.7 C) (03/23 1316) Temp src: Oral (03/23 1316) BP: 128/81 mmHg (03/23 1316) Pulse Rate: 93 (03/23 1316)  Labs:  Recent Labs  03/03/14 0433 03/03/14 2359 03/04/14 1150 03/05/14 0610  HGB 10.6*  --  11.0* 10.8*  HCT 30.8*  --  31.7* 31.0*  PLT 220  --  252 282  HEPARINUNFRC <0.10* 0.51 0.70  --     Estimated Creatinine Clearance: 98.7 ml/min (by C-G formula based on Cr of 0.76).   Assessment: 53 y/o M who was on heparin for PE, held overnight as patient had to go to OR for L orbital hematoma evacuation. CTA done this afternoon shows BL PE with significant progression from prior CT- moderate clot burden with right heart strain. Patient was therapeutic on 1150 units/hr previously, but last level on this rate was 0.7units/mL which is at the upper end of normal. Rate was decreased but a level was not checked on new rate. CBC has been stable.   Goal of Therapy:  Heparin level 0.3-0.7 units/ml Monitor platelets by anticoagulation protocol: Yes   Plan:  1. Restart heparin at 1050 units/hr 2. 6 hour HL 3. Daily HL and CBC 4. Follow for long term AC plans and for s/s bleeding  Loanne Emery D. Lucill Mauck, PharmD, BCPS Clinical Pharmacist Pager: [phone] 03/05/2014 3:13 PM

## 2014-03-05 NOTE — Progress Notes (Signed)
MD at bedside to assess pts vision.  Pt able to read well off of card and reports no double vision when holding fingers up.  MD reporting pts vision same as before initial surgical procedure.

## 2014-03-05 NOTE — Clinical Social Work Note (Signed)
Clinical Social Worker continuing to follow patient and family for support and discharge planning needs.  CSW spoke with Diane [phone]) with [facility] regarding patient benefits for discharge.  Diane states that patient is not service connected and does not have any rehab benefits through the [place].  Patient will be eligible for home health and equipment if needed - CM aware.  Contact Information for [facility] and Equipment): Vonna Kotyk / Suszanne Finch [phone]  Clinical Social Worker has updated inpatient rehab admissions coordinator regarding patient benefit.  Patient remains medically unstable for any level of rehab at this time.  MD to order inpatient rehab consult once appropriate.  CSW remains available for support and to facilitate patient discharge needs once medically ready.  Barbette Or, [place]

## 2014-03-05 NOTE — Progress Notes (Signed)
POD# 2 open treatment orbital floor left with implant  Called for increased swelling left eye, pain heparing gtt held Pt had emesis approx 2345 3.22.15 Dr. Barry Dienes ordered stat CT    Temp:  [98.2 F (36.8 C)-98.6 F (37 C)] 98.6 F (37 C) (03/23 0202) Pulse Rate:  [61-65] 63 (03/23 0202) Resp:  [17-20] 17 (03/23 0202) BP: (137-171)/(81-104) 171/104 mmHg (03/23 0202) SpO2:  [95 %-99 %] 96 % (03/23 0202)  Pt examined in CT Lethargic +light sensitivity Proptosis left decreased ROM chemosis  A/P OR now for evacuation hematoma, lateral canthotomy. Spoke with pt wife and explained emergent OR and will likely remove floor plate. This will require additional surgery for closure and replacing plate  Irene Limbo, MD [facility] Plastic & Reconstructive Surgery [phone]

## 2014-03-06 ENCOUNTER — Encounter (HOSPITAL_COMMUNITY): Payer: Self-pay | Admitting: Plastic Surgery

## 2014-03-06 LAB — CBC
HCT: 28.5 % — ABNORMAL LOW (ref 39.0–52.0)
Hemoglobin: 9.8 g/dL — ABNORMAL LOW (ref 13.0–17.0)
MCH: 31.4 pg (ref 26.0–34.0)
MCHC: 34.4 g/dL (ref 30.0–36.0)
MCV: 91.3 fL (ref 78.0–100.0)
PLATELETS: 282 10*3/uL (ref 150–400)
RBC: 3.12 MIL/uL — ABNORMAL LOW (ref 4.22–5.81)
RDW: 13.2 % (ref 11.5–15.5)
WBC: 6.5 10*3/uL (ref 4.0–10.5)

## 2014-03-06 LAB — HEPARIN LEVEL (UNFRACTIONATED): Heparin Unfractionated: 0.46 IU/mL (ref 0.30–0.70)

## 2014-03-06 MED ORDER — DORZOLAMIDE HCL-TIMOLOL MAL 2-0.5 % OP SOLN
1.0000 [drp] | Freq: Two times a day (BID) | OPHTHALMIC | Status: DC
  Administered 2014-03-06 – 2014-03-12 (×12): 1 [drp] via OPHTHALMIC
  Filled 2014-03-06 (×2): qty 10

## 2014-03-06 MED ORDER — PREDNISOLONE ACETATE 1 % OP SUSP
1.0000 [drp] | Freq: Two times a day (BID) | OPHTHALMIC | Status: DC
  Administered 2014-03-06 – 2014-03-12 (×13): 1 [drp] via OPHTHALMIC
  Filled 2014-03-06: qty 5
  Filled 2014-03-06: qty 1

## 2014-03-06 MED ORDER — ARTIFICIAL TEARS OP OINT
TOPICAL_OINTMENT | OPHTHALMIC | Status: DC
  Administered 2014-03-06: 20:00:00 via OPHTHALMIC
  Administered 2014-03-06: 1 via OPHTHALMIC
  Administered 2014-03-06 (×2): via OPHTHALMIC
  Administered 2014-03-06 (×2): 1 via OPHTHALMIC
  Administered 2014-03-07: 22:00:00 via OPHTHALMIC
  Administered 2014-03-07 (×2): 1 via OPHTHALMIC
  Administered 2014-03-07 (×2): via OPHTHALMIC
  Administered 2014-03-07: 1 via OPHTHALMIC
  Administered 2014-03-07 (×3): via OPHTHALMIC
  Administered 2014-03-07: 1 via OPHTHALMIC
  Administered 2014-03-08 (×2): via OPHTHALMIC
  Administered 2014-03-08 (×3): 1 via OPHTHALMIC
  Administered 2014-03-08: 02:00:00 via OPHTHALMIC
  Administered 2014-03-08: 1 via OPHTHALMIC
  Administered 2014-03-08 (×3): via OPHTHALMIC
  Administered 2014-03-08: 1 via OPHTHALMIC
  Administered 2014-03-08: 15:00:00 via OPHTHALMIC
  Administered 2014-03-08 – 2014-03-09 (×2): 1 via OPHTHALMIC
  Administered 2014-03-09 (×4): via OPHTHALMIC
  Administered 2014-03-09: 1 via OPHTHALMIC
  Administered 2014-03-09 – 2014-03-11 (×25): via OPHTHALMIC
  Filled 2014-03-06 (×5): qty 3.5

## 2014-03-06 NOTE — Progress Notes (Signed)
POD# 4 open treatment orbital floor left with implant POD#1 evacuation hematoma Left orbit and maxillary sinus, lateral canthotomy  Heparin gtt resumed, transferred to ICU for progression PE noted on CT chest repeat Appreciate Ophthalmology evaluation  Temp:  [97.8 F (36.6 C)-98.8 F (37.1 C)] 98.4 F (36.9 C) (03/24 0714) Pulse Rate:  [54-93] 56 (03/24 0716) Resp:  [11-19] 14 (03/24 0716) BP: (101-174)/(53-103) 147/90 mmHg (03/24 0716) SpO2:  [98 %-100 %] 99 % (03/24 0716)  PE Left pupil 6-7 mm minimal reaction Right pupil 6 minimal reaction Alert no pain Left eye chemosis   A/P D/c Frost stitch Continue medial tarsorrhaphy until chemosis improves On prednisolone gtt- will d/c Tobradex and needs ophthalmic lubricant q 2 hrs for chemosis On Cosopt for IOC Continues on Heparin gtt Keep head elevated all times  Irene Limbo, MD [facility] Plastic & Reconstructive Surgery [phone]
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
## 2014-03-06 NOTE — Progress Notes (Signed)
ANTICOAGULATION CONSULT NOTE - Follow Up Consult  Pharmacy Consult for Heparin Indication: pulmonary embolus  No Known Allergies Patient Measurements: Height: 5\' 7"  (170.2 cm) Weight: 171 lb (77.565 kg) IBW/kg (Calculated) : 66.1 Heparin Dosing Weight: 77.5 Vital Signs: Temp: 97.9 F (36.6 C) (03/24 1151) Temp src: Oral (03/24 1151) BP: 112/75 mmHg (03/24 1130) Pulse Rate: 62 (03/24 1130) Labs:  Recent Labs  03/04/14 1150 03/05/14 0610 03/05/14 2208 03/06/14 0305  HGB 11.0* 10.8*  --  9.8*  HCT 31.7* 31.0*  --  28.5*  PLT 252 282  --  282  HEPARINUNFRC 0.70  --  0.43 0.46   Estimated Creatinine Clearance: 98.7 ml/min (by C-G formula based on Cr of 0.76).  Medications:  Heparin at 1050 units/hr  Assessment: 52 YOM on heparin IV for PE.  Heparin level this AM is therapeutic at 0.46 on current rate. H/H with slight trend down post-operatively. Platelets are stable. No bleeding reported.   Goal of Therapy:  Heparin level 0.3-0.7 units/ml Monitor platelets by anticoagulation protocol: Yes   Plan:  1. Continue Heparin at 1050 units/hr.  2. Follow-up daily heparin level and CBC. 3. Follow-up transition/overlap to oral anticoagulation when able.   Sloan Leiter, PharmD, BCPS Clinical Pharmacist [phone]  03/06/2014,1:47 PM
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
## 2014-03-07 DIAGNOSIS — S329XXA Fracture of unspecified parts of lumbosacral spine and pelvis, initial encounter for closed fracture: Secondary | ICD-10-CM

## 2014-03-07 LAB — CBC
HCT: 30.7 % — ABNORMAL LOW (ref 39.0–52.0)
Hemoglobin: 10.4 g/dL — ABNORMAL LOW (ref 13.0–17.0)
MCH: 31.4 pg (ref 26.0–34.0)
MCHC: 33.9 g/dL (ref 30.0–36.0)
MCV: 92.7 fL (ref 78.0–100.0)
Platelets: 328 10*3/uL (ref 150–400)
RBC: 3.31 MIL/uL — AB (ref 4.22–5.81)
RDW: 13.1 % (ref 11.5–15.5)
WBC: 5.9 10*3/uL (ref 4.0–10.5)

## 2014-03-07 LAB — HEPARIN LEVEL (UNFRACTIONATED): HEPARIN UNFRACTIONATED: 0.55 [IU]/mL (ref 0.30–0.70)

## 2014-03-07 MED ORDER — TRAMADOL HCL 50 MG PO TABS
50.0000 mg | ORAL_TABLET | Freq: Four times a day (QID) | ORAL | Status: DC | PRN
  Administered 2014-03-07: 100 mg via ORAL
  Administered 2014-03-07: 50 mg via ORAL
  Administered 2014-03-08: 100 mg via ORAL
  Administered 2014-03-08: 50 mg via ORAL
  Administered 2014-03-09 – 2014-03-11 (×3): 100 mg via ORAL
  Filled 2014-03-07 (×4): qty 2
  Filled 2014-03-07 (×2): qty 1
  Filled 2014-03-07: qty 2

## 2014-03-07 MED ORDER — WARFARIN - PHARMACIST DOSING INPATIENT
Freq: Every day | Status: DC
  Administered 2014-03-07 – 2014-03-08 (×2)

## 2014-03-07 MED ORDER — WARFARIN SODIUM 7.5 MG PO TABS
7.5000 mg | ORAL_TABLET | Freq: Once | ORAL | Status: AC
  Administered 2014-03-07: 7.5 mg via ORAL
  Filled 2014-03-07: qty 1
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
## 2014-03-07 NOTE — Progress Notes (Signed)
Gave report to [place] on 6N. All belongings and FMLA papers transferred with pt. FMLA papers given to pt.

## 2014-03-07 NOTE — Progress Notes (Signed)
POD# 5 open treatment orbital floor left with implant POD#2 evacuation hematoma Left orbit and maxillary sinus, lateral canthotomy  No events, easily arousable  Temp:  [97.9 F (36.6 C)-98.5 F (36.9 C)] 98.3 F (36.8 C) (03/25 0319) Pulse Rate:  [56-64] 57 (03/25 0319) Resp:  [10-17] 17 (03/25 0319) BP: (112-147)/(71-92) 144/92 mmHg (03/25 0319) SpO2:  [88 %-99 %] 97 % (03/25 0319)  PE  Left eye chemosis, improved small amt Pupils 5 to 4 left Pupil 5 to 3 mm right Medial tarsorrhapy in place Gross vision intact No visible conjunctiva with eyes closed A/P  Continue medial tarsorrhaphy until chemosis improves Continue lubrication eye On prednisolone  And Cosopt  Continues on Heparin gtt Keep head elevated all times  Irene Limbo, MD [facility] Plastic & Reconstructive Surgery [phone]
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
## 2014-03-07 NOTE — Progress Notes (Signed)
ANTICOAGULATION CONSULT NOTE - Follow Up Consult  Pharmacy Consult for Heparin and Coumadin Indication: pulmonary embolus  No Known Allergies Patient Measurements: Height: 5\' 7"  (170.2 cm) Weight: 171 lb (77.565 kg) IBW/kg (Calculated) : 66.1 Heparin Dosing Weight: 77.5 Vital Signs: Temp: 98.5 F (36.9 C) (03/25 1111) Temp src: Oral (03/25 1111) BP: 150/81 mmHg (03/25 0710) Pulse Rate: 64 (03/25 0710) Labs:  Recent Labs  03/05/14 0610 03/05/14 2208 03/06/14 0305 03/07/14 0234  HGB 10.8*  --  9.8* 10.4*  HCT 31.0*  --  28.5* 30.7*  PLT 282  --  282 328  HEPARINUNFRC  --  0.43 0.46 0.55   Estimated Creatinine Clearance: 98.7 ml/min (by C-G formula based on Cr of 0.76).  Baseline INR 1.01 on 3/19  Medications:  . dextrose 5 % and 0.9 % NaCl with KCl 20 mEq/L 50 mL/hr at 03/07/14 0600  . heparin 1,050 Units/hr (03/07/14 0600)    Assessment: Nicholas Ayala on heparin IV for PE.  Heparin level this AM is therapeutic at 0.55 on current rate. Hemoglobin and platelets are stable today. No bleeding reported.  He is now to begin Coumadin.  Note he is also on Casodex (bicalutamide) which can increase the effects of Coumadin  Goal of Therapy:  Heparin level 0.3-0.7 units/ml Monitor platelets by anticoagulation protocol: Yes INR 2-3   Plan:  1. Continue Heparin at 1050 units/hr.  2. Follow-up daily heparin level and CBC. 3. Coumadin 7.5mg  today 4. Daily PT/INR  Legrand Como, Pharm.D., BCPS, AAHIVP Clinical Pharmacist Phone: [phone] or [phone] 03/07/2014, 11:19 AM

## 2014-03-07 NOTE — Progress Notes (Signed)
Attempted to call report x1. Left callback number

## 2014-03-07 NOTE — Progress Notes (Signed)
Can start on couomadin.  Would not put on Xarelto because of possible need to reverse acutely.  This patient has been seen and I agree with the findings and treatment plan.  [name]. Dahlia Bailiff, MD, [place] [phone] (pager) [phone] (direct pager) Trauma Surgeon

## 2014-03-08 LAB — CBC
HCT: 29.7 % — ABNORMAL LOW (ref 39.0–52.0)
HEMOGLOBIN: 10.4 g/dL — AB (ref 13.0–17.0)
MCH: 31.9 pg (ref 26.0–34.0)
MCHC: 35 g/dL (ref 30.0–36.0)
MCV: 91.1 fL (ref 78.0–100.0)
Platelets: 381 10*3/uL (ref 150–400)
RBC: 3.26 MIL/uL — ABNORMAL LOW (ref 4.22–5.81)
RDW: 12.8 % (ref 11.5–15.5)
WBC: 6 10*3/uL (ref 4.0–10.5)

## 2014-03-08 LAB — HEPARIN LEVEL (UNFRACTIONATED): HEPARIN UNFRACTIONATED: 0.6 [IU]/mL (ref 0.30–0.70)

## 2014-03-08 LAB — PROTIME-INR
INR: 1.03 (ref 0.00–1.49)
PROTHROMBIN TIME: 13.3 s (ref 11.6–15.2)

## 2014-03-08 MED ORDER — COUMADIN BOOK
Freq: Once | Status: AC
  Administered 2014-03-08: 1
  Filled 2014-03-08: qty 1

## 2014-03-08 MED ORDER — ENOXAPARIN SODIUM 80 MG/0.8ML ~~LOC~~ SOLN
80.0000 mg | Freq: Two times a day (BID) | SUBCUTANEOUS | Status: DC
  Administered 2014-03-09 – 2014-03-12 (×7): 80 mg via SUBCUTANEOUS
  Filled 2014-03-08 (×10): qty 0.8

## 2014-03-08 MED ORDER — HYDRALAZINE HCL 20 MG/ML IJ SOLN
10.0000 mg | INTRAMUSCULAR | Status: DC | PRN
  Administered 2014-03-08: 10 mg via INTRAVENOUS
  Filled 2014-03-08: qty 1

## 2014-03-08 MED ORDER — WARFARIN VIDEO: Freq: Once | Status: DC

## 2014-03-08 MED ORDER — ENOXAPARIN SODIUM 80 MG/0.8ML ~~LOC~~ SOLN
80.0000 mg | Freq: Once | SUBCUTANEOUS | Status: AC
  Administered 2014-03-08: 80 mg via SUBCUTANEOUS
  Filled 2014-03-08: qty 0.8

## 2014-03-08 MED ORDER — WARFARIN SODIUM 7.5 MG PO TABS
7.5000 mg | ORAL_TABLET | Freq: Once | ORAL | Status: AC
  Administered 2014-03-08: 7.5 mg via ORAL
  Filled 2014-03-08: qty 1
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
## 2014-03-08 NOTE — Progress Notes (Signed)
MD on call notified of pt 2/10 chest pressure vital signs 121/84 hr 59 no shortness of breath, nausea or sweating EKG ordered NSR. Will continue to monitor. Arthor Captain LPN

## 2014-03-08 NOTE — Progress Notes (Signed)
POD# 6 open treatment orbital floor left with implant POD#3 evacuation hematoma Left orbit and maxillary sinus, lateral canthotomy  Coumadin started  Temp:  [97.8 F (36.6 C)-98.5 F (36.9 C)] 97.8 F (36.6 C) (03/26 0515) Pulse Rate:  [59-69] 59 (03/26 0515) Resp:  [13-16] 16 (03/26 0515) BP: (113-156)/(71-95) 121/84 mmHg (03/26 0515) SpO2:  [92 %-100 %] 92 % (03/26 0515) Weight:  [77.565 kg (171 lb)] 77.565 kg (171 lb) (03/25 1451)  PE  Left eye chemosis, improved though still significant lateral scleral triangle No lagophthalmos Pupils equal Medial tarsorrhapy in place Gross vision intact  A/P  Continue medial tarsorrhaphy -hopefully out in next 1-2 days Continue lubrication eye, start massage up and out of lower lid On prednisolone  And Cosopt  Continues on Heparin gtt , Coumadin Keep head elevated all times  Irene Limbo, MD [facility] Plastic & Reconstructive Surgery [phone]

## 2014-03-08 NOTE — Progress Notes (Addendum)
ANTICOAGULATION CONSULT NOTE - Follow Up Consult  Pharmacy Consult for Heparin->Lovenox and Coumadin Indication: pulmonary embolus  No Known Allergies Patient Measurements: Height: 5\' 7"  (170.2 cm) Weight: 171 lb (77.565 kg) IBW/kg (Calculated) : 66.1 Heparin Dosing Weight: 77.5 Vital Signs: Temp: 97.8 F (36.6 C) (03/26 0515) Temp src: Oral (03/26 0515) BP: 121/84 mmHg (03/26 0515) Pulse Rate: 59 (03/26 0515) Labs:  Recent Labs  03/06/14 0305 03/07/14 0234 03/08/14 0531  HGB 9.8* 10.4* 10.4*  HCT 28.5* 30.7* 29.7*  PLT 282 328 381  LABPROT  --   --  13.3  INR  --   --  1.03  HEPARINUNFRC 0.46 0.55 0.60   Estimated Creatinine Clearance: 98.7 ml/min (by C-G formula based on Cr of 0.76).  Baseline INR 1.01 on 3/19  Medications:  . dextrose 5 % and 0.9 % NaCl with KCl 20 mEq/L 50 mL/hr at 03/07/14 2335  . heparin 1,050 Units/hr (03/07/14 0600)    Assessment: 53 year old male receiving Heparin bridging to Coumadin for PE.  Heparin level this AM is therapeutic at 0.6 on current rate. Hemoglobin and platelets are stable today. No bleeding reported.  He started Coumadin 3/25 and his INR is unchanged as would be expected after only 1 dose of Coumadin.  Note he is also on Casodex (bicalutamide) which can increase the effects of Coumadin  Goal of Therapy:  Heparin level 0.3-0.7 units/ml Monitor platelets by anticoagulation protocol: Yes INR 2-3   Plan:  1. Continue Heparin at 1050 units/hr.  2. Follow-up daily heparin level and CBC. 3. Coumadin 7.5mg  today 4. Daily PT/INR  Legrand Como, Pharm.D., BCPS, AAHIVP Clinical Pharmacist Phone: [phone] or [phone] 03/08/2014, 10:58 AM  Addendum: MD wants to change heparin to lovenox.  Goal of Therapy: Anti-Xa level 0.6-1 units/mL (4 hours post lovenox dose)  Plan: 1. D/c heparin 2. Lovenox 80mg  SQ q12h - first dose 1 hour post heparin d/c 3. CBC q72h while on lovenox 4. Will continue to follow  Sherlon Handing,  PharmD, BCPS Clinical pharmacist, pager [phone] 03/08/2014  3:45 PM

## 2014-03-08 NOTE — Progress Notes (Signed)
Rehab admissions - Evaluated for possible admission.  Noted patient has progressed to supervision level and ambulated 20 ft.  Doing too well for acute inpatient rehab admission.  Recommend home with [facility] therapies.  Call me for questions. #[phone]
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
## 2014-03-08 NOTE — Progress Notes (Signed)
Conversion to Lovenox and bridge with coumadin.  This patient has been seen and I agree with the findings and treatment plan.  [name]. Dahlia Bailiff, MD, [place] [phone] (pager) [phone] (direct pager) Trauma Surgeon

## 2014-03-09 LAB — PROTIME-INR
INR: 1.45 (ref 0.00–1.49)
Prothrombin Time: 17.3 s — ABNORMAL HIGH (ref 11.6–15.2)

## 2014-03-09 MED ORDER — DIPHENHYDRAMINE HCL 50 MG/ML IJ SOLN: 12.5000 mg | Freq: Once | INTRAMUSCULAR | Status: DC

## 2014-03-09 MED ORDER — METOPROLOL TARTRATE 25 MG PO TABS: 25.0000 mg | ORAL_TABLET | Freq: Two times a day (BID) | ORAL | Status: DC

## 2014-03-09 MED ORDER — METHYLPREDNISOLONE 4 MG PO KIT
8.0000 mg | PACK | Freq: Every evening | ORAL | Status: AC
  Administered 2014-03-10: 8 mg via ORAL

## 2014-03-09 MED ORDER — WARFARIN VIDEO
Freq: Once | Status: AC
  Administered 2014-03-09: 16:00:00

## 2014-03-09 MED ORDER — METHYLPREDNISOLONE 4 MG PO KIT
4.0000 mg | PACK | Freq: Four times a day (QID) | ORAL | Status: DC
Start: 2014-03-11 — End: 2014-03-09

## 2014-03-09 MED ORDER — WARFARIN SODIUM 7.5 MG PO TABS
7.5000 mg | ORAL_TABLET | Freq: Once | ORAL | Status: AC
  Administered 2014-03-09: 7.5 mg via ORAL
  Filled 2014-03-09: qty 1

## 2014-03-09 MED ORDER — METHYLPREDNISOLONE 4 MG PO KIT
4.0000 mg | PACK | ORAL | Status: AC
  Administered 2014-03-09: 4 mg via ORAL

## 2014-03-09 MED ORDER — METHYLPREDNISOLONE 4 MG PO KIT
8.0000 mg | PACK | Freq: Every evening | ORAL | Status: AC
  Administered 2014-03-09: 8 mg via ORAL

## 2014-03-09 MED ORDER — METHYLPREDNISOLONE 4 MG PO KIT
4.0000 mg | PACK | Freq: Three times a day (TID) | ORAL | Status: AC
  Administered 2014-03-10 (×3): 4 mg via ORAL

## 2014-03-09 MED ORDER — METHYLPREDNISOLONE 4 MG PO TABS: 4.0000 mg | ORAL_TABLET | Freq: Three times a day (TID) | ORAL | Status: DC

## 2014-03-09 MED ORDER — METHYLPREDNISOLONE 4 MG PO KIT
4.0000 mg | PACK | Freq: Four times a day (QID) | ORAL | Status: DC
  Administered 2014-03-11 – 2014-03-12 (×6): 4 mg via ORAL

## 2014-03-09 MED ORDER — METOPROLOL TARTRATE 50 MG PO TABS
50.0000 mg | ORAL_TABLET | Freq: Two times a day (BID) | ORAL | Status: DC
  Administered 2014-03-09: 50 mg via ORAL
  Filled 2014-03-09 (×3): qty 1

## 2014-03-09 MED ORDER — METHYLPREDNISOLONE 4 MG PO KIT
8.0000 mg | PACK | Freq: Every morning | ORAL | Status: AC
  Administered 2014-03-09: 8 mg via ORAL
  Filled 2014-03-09: qty 21

## 2014-03-09 MED ORDER — METHYLPREDNISOLONE 4 MG PO TABS: 4.0000 mg | ORAL_TABLET | Freq: Every day | ORAL | Status: DC

## 2014-03-09 NOTE — Progress Notes (Signed)
POD# 7 open treatment orbital floor left with implant POD# 4 evacuation hematoma Left orbit and maxillary sinus, lateral canthotomy  Planning to go home  Temp:  [98.2 F (36.8 C)-99.6 F (37.6 C)] 99.6 F (37.6 C) (03/27 0533) Pulse Rate:  [59-70] 60 (03/27 0540) Resp:  [16-18] 16 (03/27 0533) BP: (115-182)/(68-97) 127/81 mmHg (03/27 0540) SpO2:  [91 %-93 %] 91 % (03/27 0533)  PE  Left eye chemosis has increased this am and there is exposure conjunctiva laterally with eyes closed Pupils equal Medial tarsorrhapy in place Gross vision intact  A/P medial tarsorrhaphy removed- has worse chemosis with exposrue laterally today and will start on Medrol dose pak and need to keep covered all times except shower. Applied liberal ophthalmic lubricant and applied eye pad and light ace wrap to hold in place. Can also tape eye pad over area.   If discharges, needs to continue ophthalmic lubrication eye q2 hrs while awake continue massage up and out of lower lid Continue prednisolone and Cosopt until f/u Dr. Nehemiah Massed Ophthalmology within week  Pt continues on IV lopressor and has been hypertensive throughout hospital stay- if d/c needs to start BP oral meds with early follow up with PCP  Keep head elevated   F/u with myself arranged for next week  Irene Limbo, MD [facility] Plastic & Reconstructive Surgery [phone]

## 2014-03-09 NOTE — Progress Notes (Signed)
Trying to make arrangements for outpatient coumadin monitoring and follow-up care.  This patient has been seen and I agree with the findings and treatment plan.  [name]. Dahlia Bailiff, MD, [place] [phone] (pager) [phone] (direct pager) Trauma Surgeon
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
## 2014-03-09 NOTE — Progress Notes (Addendum)
Md made aware the epsodes of bradycardia/ Apical pulse at this time is 52, BP 136/82. Will continue to monitor.Patient denies any dizziness.

## 2014-03-09 NOTE — Progress Notes (Signed)
ANTICOAGULATION CONSULT NOTE - Follow Up Consult  Pharmacy Consult for Lovenox and Coumadin Indication: pulmonary embolus  No Known Allergies Patient Measurements: Height: 5\' 7"  (170.2 cm) Weight: 171 lb (77.565 kg) IBW/kg (Calculated) : 66.1 Heparin Dosing Weight: 77.5 Vital Signs: Temp: 98.2 F (36.8 C) (03/27 1414) Temp src: Oral (03/27 1414) BP: 128/74 mmHg (03/27 1414) Pulse Rate: 66 (03/27 1414) Labs:  Recent Labs  03/07/14 0234 03/08/14 0531 03/09/14 0513  HGB 10.4* 10.4*  --   HCT 30.7* 29.7*  --   PLT 328 381  --   LABPROT  --  13.3 17.3*  INR  --  1.03 1.45  HEPARINUNFRC 0.55 0.60  --    Estimated Creatinine Clearance: 98.7 ml/min (by C-G formula based on Cr of 0.76).  Baseline INR 1.01 on 3/19  Medications:     Assessment: 53 year old male receiving LMWH bridging to Coumadin for PE.  Heparin converted to LMWH yesterday. Today is day #3/5 of minimum overlap required for VTE treatment.  Hemoglobin and platelets were stable yesterday. No bleeding reported.    L orbit hematoma POD #4 evacuation of hematoma L orbit; medial tarsorrhapy removed today.  Trying to arrange oupt coumadin f/u w/ [facility].   Note he is also on Casodex (bicalutamide) which can increase the effects of Coumadin.  INR 1.45 today after 2 doses of coumadin 7.5 mg.   Goal of Therapy:  Anti-Xa level 0.6-1 units/mL (4 hours post lovenox dose) Monitor platelets by anticoagulation protocol: Yes INR 2-3   Plan:  1. Continue Lovenox 80mg  SQ q12h  2. repeat Coumadin 7.5mg  today 3. Would write discharge prescription for 5 mg tablets for dosing flexibility. Would dc home on 5 mg daily. 4. Daily INR, CBC q72 hrs while on LMWH 5. Day # 3/5 of minimum overlap for VTE treatment. 6. Home meds of aspirin 81 mg and zoloft 100 mg daily have not been resumed. Please address.  7. Coumadin book given 3/26; coumadin video ordered 3/27 but not done.  Video reordered for today Eudelia Bunch,  Pharm.D. [phone] 03/09/2014 2:51 PM
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
## 2014-03-10 LAB — PROTIME-INR
INR: 1.94 — AB (ref 0.00–1.49)
Prothrombin Time: 21.6 s — ABNORMAL HIGH (ref 11.6–15.2)

## 2014-03-10 MED ORDER — WARFARIN SODIUM 5 MG PO TABS: 5.0000 mg | ORAL_TABLET | Freq: Every day | ORAL | Status: DC

## 2014-03-10 MED ORDER — WARFARIN SODIUM 5 MG PO TABS
5.0000 mg | ORAL_TABLET | Freq: Once | ORAL | Status: AC
  Administered 2014-03-10: 5 mg via ORAL
  Filled 2014-03-10: qty 1

## 2014-03-10 MED ORDER — KETOROLAC TROMETHAMINE 0.5 % OP SOLN: 1.0000 [drp] | Freq: Four times a day (QID) | OPHTHALMIC | Status: DC

## 2014-03-10 MED ORDER — METHYLPREDNISOLONE (PAK) 4 MG PO TABS: ORAL_TABLET | ORAL | Status: DC

## 2014-03-10 MED ORDER — DORZOLAMIDE HCL-TIMOLOL MAL 2-0.5 % OP SOLN: 1.0000 [drp] | Freq: Two times a day (BID) | OPHTHALMIC | Status: DC

## 2014-03-10 MED ORDER — ARTIFICIAL TEARS OP OINT: TOPICAL_OINTMENT | OPHTHALMIC | Status: DC

## 2014-03-10 MED ORDER — PREDNISOLONE ACETATE 1 % OP SUSP: 1.0000 [drp] | Freq: Two times a day (BID) | OPHTHALMIC | Status: DC

## 2014-03-10 MED ORDER — TRAMADOL HCL 50 MG PO TABS: 50.0000 mg | ORAL_TABLET | Freq: Four times a day (QID) | ORAL | Status: DC | PRN

## 2014-03-10 MED ORDER — ENOXAPARIN SODIUM 80 MG/0.8ML ~~LOC~~ SOLN: 80.0000 mg | Freq: Two times a day (BID) | SUBCUTANEOUS | Status: DC

## 2014-03-10 NOTE — Progress Notes (Signed)
Pt seen and examined and agree with note  Leighton Ruff. Redmond Pulling, MD, FACS General, Bariatric, & Minimally Invasive Surgery [facility] Surgery, [place]

## 2014-03-10 NOTE — Discharge Instructions (Signed)
Please keep left eye covered all times except medication administration and bathing. Ok to remove for shower. Do no need to wake up at night to change or apply lubricant.  Apply liberal ophthalmic lubricant and cover with eye pad and light Ace wrap to hold in place.  Keep head elevated all times.   You may put weight down on your right leg only to transfer from bed to chair and back. No walking.    Information on my medicine - Coumadin   (Warfarin)  This medication education was reviewed with me or my healthcare representative as part of my discharge preparation.  The pharmacist that spoke with me during my hospital stay was:  Norva Riffle, RPH  Why was Coumadin prescribed for you? Coumadin was prescribed for you because you have a blood clot or a medical condition that can cause an increased risk of forming blood clots. Blood clots can cause serious health problems by blocking the flow of blood to the heart, lung, or brain. Coumadin can prevent harmful blood clots from forming. As a reminder your indication for Coumadin is:   Pulmonary Embolism Treatment  What test will check on my response to Coumadin? While on Coumadin (warfarin) you will need to have an INR test regularly to ensure that your dose is keeping you in the desired range. The INR (international normalized ratio) number is calculated from the result of the laboratory test called prothrombin time (PT).  If an INR APPOINTMENT HAS NOT ALREADY BEEN MADE FOR YOU please schedule an appointment to have this lab work done by your health care provider within 7 days. Your INR goal is usually a number between:  2 to 3 or your provider may give you a more narrow range like 2-2.5.  Ask your health care provider during an office visit what your goal INR is.  What  do you need to  know  About  COUMADIN? Take Coumadin (warfarin) exactly as prescribed by your healthcare provider about the same time each day.  DO NOT stop taking without  talking to the doctor who prescribed the medication.  Stopping without other blood clot prevention medication to take the place of Coumadin may increase your risk of developing a new clot or stroke.  Get refills before you run out.  What do you do if you miss a dose? If you miss a dose, take it as soon as you remember on the same day then continue your regularly scheduled regimen the next day.  Do not take two doses of Coumadin at the same time.  Important Safety Information A possible side effect of Coumadin (Warfarin) is an increased risk of bleeding. You should call your healthcare provider right away if you experience any of the following:   Bleeding from an injury or your nose that does not stop.   Unusual colored urine (red or dark brown) or unusual colored stools (red or black).   Unusual bruising for unknown reasons.   A serious fall or if you hit your head (even if there is no bleeding).  Some foods or medicines interact with Coumadin (warfarin) and might alter your response to warfarin. To help avoid this:   Eat a balanced diet, maintaining a consistent amount of Vitamin K.   Notify your provider about major diet changes you plan to make.   Avoid alcohol or limit your intake to 1 drink for women and 2 drinks for men per day. (1 drink is 5 oz. wine, 12 oz.  beer, or 1.5 oz. liquor.)  Make sure that ANY health care provider who prescribes medication for you knows that you are taking Coumadin (warfarin).  Also make sure the healthcare provider who is monitoring your Coumadin knows when you have started a new medication including herbals and non-prescription products.  Coumadin (Warfarin)  Major Drug Interactions  Increased Warfarin Effect Decreased Warfarin Effect  Alcohol (large quantities) Antibiotics (esp. Septra/Bactrim, Flagyl, Cipro) Amiodarone (Cordarone) Aspirin (ASA) Cimetidine (Tagamet) Megestrol (Megace) NSAIDs (ibuprofen, naproxen, etc.) Piroxicam  (Feldene) Propafenone (Rythmol SR) Propranolol (Inderal) Isoniazid (INH) Posaconazole (Noxafil) Barbiturates (Phenobarbital) Carbamazepine (Tegretol) Chlordiazepoxide (Librium) Cholestyramine (Questran) Griseofulvin Oral Contraceptives Rifampin Sucralfate (Carafate) Vitamin K   Coumadin (Warfarin) Major Herbal Interactions  Increased Warfarin Effect Decreased Warfarin Effect  Garlic Ginseng Ginkgo biloba Coenzyme Q10 Green tea St. Johns wort    Coumadin (Warfarin) FOOD Interactions  Eat a consistent number of servings per week of foods HIGH in Vitamin K (1 serving =  cup)  Collards (cooked, or boiled & drained) Kale (cooked, or boiled & drained) Mustard greens (cooked, or boiled & drained) Parsley *serving size only =  cup Spinach (cooked, or boiled & drained) Swiss chard (cooked, or boiled & drained) Turnip greens (cooked, or boiled & drained)  Eat a consistent number of servings per week of foods MEDIUM-HIGH in Vitamin K (1 serving = 1 cup)  Asparagus (cooked, or boiled & drained) Broccoli (cooked, boiled & drained, or raw & chopped) Brussel sprouts (cooked, or boiled & drained) *serving size only =  cup Lettuce, raw (green leaf, endive, romaine) Spinach, raw Turnip greens, raw & chopped   These websites have more information on Coumadin (warfarin):  FailFactory.se; VeganReport.com.au;
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
## 2014-03-10 NOTE — Progress Notes (Signed)
POD# 8 open treatment orbital floor left with implant POD# 5 evacuation hematoma Left orbit and maxillary sinus, lateral canthotomy   Temp:  [97.9 F (36.6 C)-98.9 F (37.2 C)] 97.9 F (36.6 C) (03/28 0646) Pulse Rate:  [51-66] 51 (03/28 0646) Resp:  [16-18] 16 (03/28 0646) BP: (128-138)/(74-84) 130/84 mmHg (03/28 0646) SpO2:  [94 %-96 %] 94 % (03/28 0646)  PE  Left eye chemosis much improved with no exposure conjunctiva noted   A/P Chemosis with exposure laterally: finish Medrol dose pak and will continue to wrap/patch for next day. Ok to remove for shower. Do no need to wake up at night to change or apply lubricant.  Applied liberal ophthalmic lubricant and applied eye pad and light ace wrap to hold in place. Can also tape eye pad over area.   Continue prednisolone and Cosopt until f/u Dr. Nehemiah Massed Ophthalmology within week  Keep head elevated   F/u with myself arranged for next week. Filled out FMLA paperwork for wife.   Irene Limbo, MD [facility] Plastic & Reconstructive Surgery [phone]

## 2014-03-10 NOTE — Discharge Summary (Signed)
Laynie Espy M. Osborne Serio, MD, FACS General, Bariatric, & Minimally Invasive Surgery [facility]  
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
## 2014-03-10 NOTE — Progress Notes (Signed)
ANTICOAGULATION CONSULT NOTE - Follow Up Consult  Pharmacy Consult for Lovenox and Coumadin Indication: pulmonary embolus  No Known Allergies Patient Measurements: Height: 5\' 7"  (170.2 cm) Weight: 171 lb (77.565 kg) IBW/kg (Calculated) : 66.1 Heparin Dosing Weight: 77.5 Vital Signs: Temp: 97.9 F (36.6 C) (03/28 0646) Temp src: Oral (03/28 0646) BP: 130/84 mmHg (03/28 0646) Pulse Rate: 51 (03/28 0646) Labs:  Recent Labs  03/08/14 0531 03/09/14 0513 03/10/14 0507  HGB 10.4*  --   --   HCT 29.7*  --   --   PLT 381  --   --   LABPROT 13.3 17.3* 21.6*  INR 1.03 1.45 1.94*  HEPARINUNFRC 0.60  --   --    Estimated Creatinine Clearance: 98.7 ml/min (by C-G formula based on Cr of 0.76).  Baseline INR 1.01 on 3/19  Medications:     Assessment: 53 year old male receiving LMWH bridging to Coumadin for PE.  Heparin converted to LMWH 3/26. Today is day #4/5 of minimum overlap required for VTE treatment.  Hemoglobin and platelets were stable yesterday. No bleeding reported.     INR 1.94 today after 3 doses of coumadin 7.5 mg.   Goal of Therapy:  Anti-Xa level 0.6-1 units/mL (4 hours post lovenox dose) Monitor platelets by anticoagulation protocol: Yes INR 2-3   Plan:  1. Continue Lovenox 80mg  SQ q12h  2. Decrease Coumadin to 5mg  today 3. Would write discharge prescription for 5 mg tablets for dosing flexibility. Would d/c home on 5 mg daily. 4. Daily INR, CBC q72 hrs while on LMWH 5. Day # 4/5 of minimum overlap for VTE treatment. 6. Coumadin education done with patient.  Legrand Como, Pharm.D., BCPS, AAHIVP Clinical Pharmacist Phone: [phone] or [phone] 03/10/2014, 1:48 PM

## 2014-03-11 LAB — CBC
HCT: 35.2 % — ABNORMAL LOW (ref 39.0–52.0)
Hemoglobin: 12.1 g/dL — ABNORMAL LOW (ref 13.0–17.0)
MCH: 31.4 pg (ref 26.0–34.0)
MCHC: 34.4 g/dL (ref 30.0–36.0)
MCV: 91.4 fL (ref 78.0–100.0)
PLATELETS: 485 10*3/uL — AB (ref 150–400)
RBC: 3.85 MIL/uL — AB (ref 4.22–5.81)
RDW: 13.1 % (ref 11.5–15.5)
WBC: 7.4 10*3/uL (ref 4.0–10.5)

## 2014-03-11 LAB — PROTIME-INR
INR: 1.92 — ABNORMAL HIGH (ref 0.00–1.49)
PROTHROMBIN TIME: 21.4 s — AB (ref 11.6–15.2)

## 2014-03-11 MED ORDER — ARTIFICIAL TEARS OP OINT
TOPICAL_OINTMENT | Freq: Four times a day (QID) | OPHTHALMIC | Status: DC
  Administered 2014-03-11 – 2014-03-12 (×3): via OPHTHALMIC
  Filled 2014-03-11: qty 3.5

## 2014-03-11 MED ORDER — WARFARIN SODIUM 10 MG PO TABS
10.0000 mg | ORAL_TABLET | Freq: Once | ORAL | Status: AC
  Administered 2014-03-11: 10 mg via ORAL
  Filled 2014-03-11: qty 1

## 2014-03-11 NOTE — Progress Notes (Signed)
POD# 9 open treatment orbital floor left with implant POD# 6 evacuation hematoma Left orbit and maxillary sinus, lateral canthotomy  No events  Temp:  [97.8 F (36.6 C)-98.4 F (36.9 C)] 97.8 F (36.6 C) (03/29 0621) Pulse Rate:  [54-63] 54 (03/29 0621) Resp:  [16] 16 (03/29 0621) BP: (118-140)/(72-90) 123/85 mmHg (03/29 0621) SpO2:  [97 %-99 %] 97 % (03/29 9798)  PE  Left eye chemosis much improved with no exposure conjunctiva noted   A/P Chemosis much improved. Finish Medrol dose pak- if he discharges tomorrow 3/30 he will still have one day of steroids orally to take. D/c patching. Massage lower lid up and out. Continue ophthalmic lubricant.  Continue prednisolone and Cosopt until f/u Dr. Nehemiah Massed Ophthalmology within week  Keep head elevated   F/u with myself arranged for next week- if he continues to look this good can move out a week.  Irene Limbo, MD [facility] Plastic & Reconstructive Surgery [phone]

## 2014-03-11 NOTE — Progress Notes (Signed)
ANTICOAGULATION CONSULT NOTE - Follow Up Consult  Pharmacy Consult for Lovenox and Coumadin Indication: pulmonary embolus  No Known Allergies Patient Measurements: Height: 5\' 7"  (170.2 cm) Weight: 171 lb (77.565 kg) IBW/kg (Calculated) : 66.1 Heparin Dosing Weight: 77.5 Vital Signs: Temp: 97.8 F (36.6 C) (03/29 0621) Temp src: Oral (03/29 0621) BP: 123/85 mmHg (03/29 0621) Pulse Rate: 54 (03/29 0621) Labs:  Recent Labs  03/09/14 0513 03/10/14 0507 03/11/14 0528  HGB  --   --  12.1*  HCT  --   --  35.2*  PLT  --   --  485*  LABPROT 17.3* 21.6* 21.4*  INR 1.45 1.94* 1.92*   Estimated Creatinine Clearance: 97.5 ml/min (by C-G formula based on Cr of 0.76).  Baseline INR 1.01 on 3/19  Medications:     Assessment: 53 year old male receiving LMWH bridging to Coumadin for PE.  Heparin converted to LMWH 3/26. Today is day #5/5 of minimum overlap required for VTE treatment; however his INR remains subtherapeutic so overlap must continue.  Hemoglobin and platelets were stable yesterday. No bleeding reported.     INR 1.92 today - appears to have stabilized just shy of the therapeutic range.  Goal of Therapy:  Anti-Xa level 0.6-1 units/mL (4 hours post lovenox dose) Monitor platelets by anticoagulation protocol: Yes INR 2-3   Plan:  1. Continue Lovenox 80mg  SQ q12h  2. Increase Coumadin to 10mg  x 1 today 3. Day # 5/5 of minimum overlap for VTE treatment, but must continue until INR is therapeutic.  Legrand Como, Pharm.D., BCPS, AAHIVP Clinical Pharmacist Phone: [phone] or [phone] 03/11/2014, 10:01 AM
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
## 2014-03-11 NOTE — Progress Notes (Signed)
Awaiting placement  Imogene Burn. Georgette Dover, MD, [facility] Surgery  General/ Trauma Surgery  03/11/2014 8:37 AM

## 2014-03-12 LAB — PROTIME-INR
INR: 1.89 — ABNORMAL HIGH (ref 0.00–1.49)
PROTHROMBIN TIME: 21.1 s — AB (ref 11.6–15.2)

## 2014-03-12 NOTE — Progress Notes (Signed)
POD# 10 open treatment orbital floor left with implant POD# 7 evacuation hematoma Left orbit and maxillary sinus, lateral canthotomy  Awaiting d/c needs  Temp:  [97.6 F (36.4 C)-98 F (36.7 C)] 97.6 F (36.4 C) (03/30 0628) Pulse Rate:  [55-67] 55 (03/30 0628) Resp:  [16] 16 (03/30 0628) BP: (117-141)/(68-87) 122/78 mmHg (03/30 0628) SpO2:  [98 %-100 %] 98 % (03/30 0628)  PE  Left eye chemosis resolved EOMI   A/P Chemosis much improved. Finish Medrol dose pak- if he discharges today 3/30 he still has one day of steroids orally to take. Will d/c steroid gtt.   Massage lower lid up and out. Continue ophthalmic lubricant.  Continue Cosopt until f/u Dr. Nehemiah Massed Ophthalmology within week  Keep head elevated   F/u with myself arranged for next week  Irene Limbo, MD [facility] Plastic & Reconstructive Surgery [phone]
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
## 2014-03-12 NOTE — Progress Notes (Signed)
Feeling better. Someone is at his house awaiting DME delivery. Patient examined and I agree with the assessment and plan  Georganna Skeans, MD, MPH, FACS Trauma: [phone] General Surgery: [phone]  03/12/2014 12:29 PM
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
## 2014-04-09 ENCOUNTER — Encounter: Payer: Self-pay | Admitting: Vascular Surgery

## 2014-04-10 ENCOUNTER — Ambulatory Visit: Payer: Non-veteran care | Admitting: Vascular Surgery

## 2014-04-10 ENCOUNTER — Encounter: Payer: Self-pay | Admitting: Vascular Surgery

## 2014-04-10 ENCOUNTER — Ambulatory Visit (INDEPENDENT_AMBULATORY_CARE_PROVIDER_SITE_OTHER): Payer: Medicare Other | Admitting: Vascular Surgery

## 2014-04-10 VITALS — BP 122/90 | HR 79 | Resp 18 | Ht 67.0 in | Wt 160.5 lb

## 2014-04-10 DIAGNOSIS — I714 Abdominal aortic aneurysm, without rupture, unspecified: Secondary | ICD-10-CM | POA: Insufficient documentation
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
## 2014-05-03 ENCOUNTER — Telehealth: Payer: Self-pay | Admitting: *Deleted

## 2014-05-03 NOTE — Telephone Encounter (Signed)
Left a voicemail asking the patient to call Arbie Cookey to schedule his surgery.

## 2014-12-08 ENCOUNTER — Encounter (HOSPITAL_COMMUNITY)
Admission: EM | Disposition: A | Discharge status: Home / Self Care | Payer: Self-pay | Source: Home / Self Care | Attending: Internal Medicine

## 2014-12-08 ENCOUNTER — Encounter (HOSPITAL_COMMUNITY): Payer: Self-pay | Admitting: Oncology

## 2014-12-08 ENCOUNTER — Inpatient Hospital Stay (HOSPITAL_COMMUNITY)
Admission: EM | Admit: 2014-12-08 | Discharge: 2014-12-11 | DRG: 811 | Disposition: A | Discharge status: Home / Self Care | Payer: Medicare Other | Attending: Internal Medicine | Admitting: Internal Medicine

## 2014-12-08 ENCOUNTER — Inpatient Hospital Stay (HOSPITAL_COMMUNITY): Payer: Medicare Other

## 2014-12-08 DIAGNOSIS — D61810 Antineoplastic chemotherapy induced pancytopenia: Secondary | ICD-10-CM | POA: Diagnosis present

## 2014-12-08 DIAGNOSIS — Z8546 Personal history of malignant neoplasm of prostate: Secondary | ICD-10-CM

## 2014-12-08 DIAGNOSIS — C61 Malignant neoplasm of prostate: Secondary | ICD-10-CM | POA: Diagnosis present

## 2014-12-08 DIAGNOSIS — E876 Hypokalemia: Secondary | ICD-10-CM | POA: Diagnosis present

## 2014-12-08 DIAGNOSIS — K922 Gastrointestinal hemorrhage, unspecified: Secondary | ICD-10-CM

## 2014-12-08 DIAGNOSIS — D899 Disorder involving the immune mechanism, unspecified: Secondary | ICD-10-CM | POA: Diagnosis present

## 2014-12-08 DIAGNOSIS — Z87891 Personal history of nicotine dependence: Secondary | ICD-10-CM

## 2014-12-08 DIAGNOSIS — D649 Anemia, unspecified: Secondary | ICD-10-CM

## 2014-12-08 DIAGNOSIS — Z923 Personal history of irradiation: Secondary | ICD-10-CM | POA: Diagnosis not present

## 2014-12-08 DIAGNOSIS — D72819 Decreased white blood cell count, unspecified: Secondary | ICD-10-CM

## 2014-12-08 DIAGNOSIS — K254 Chronic or unspecified gastric ulcer with hemorrhage: Secondary | ICD-10-CM

## 2014-12-08 DIAGNOSIS — D696 Thrombocytopenia, unspecified: Secondary | ICD-10-CM

## 2014-12-08 DIAGNOSIS — K921 Melena: Secondary | ICD-10-CM

## 2014-12-08 DIAGNOSIS — R5081 Fever presenting with conditions classified elsewhere: Secondary | ICD-10-CM

## 2014-12-08 DIAGNOSIS — I2699 Other pulmonary embolism without acute cor pulmonale: Secondary | ICD-10-CM | POA: Diagnosis present

## 2014-12-08 DIAGNOSIS — Z8505 Personal history of malignant neoplasm of liver: Secondary | ICD-10-CM | POA: Diagnosis not present

## 2014-12-08 DIAGNOSIS — I723 Aneurysm of iliac artery: Secondary | ICD-10-CM | POA: Diagnosis present

## 2014-12-08 DIAGNOSIS — D709 Neutropenia, unspecified: Secondary | ICD-10-CM | POA: Diagnosis present

## 2014-12-08 DIAGNOSIS — D638 Anemia in other chronic diseases classified elsewhere: Secondary | ICD-10-CM | POA: Diagnosis present

## 2014-12-08 DIAGNOSIS — T451X5A Adverse effect of antineoplastic and immunosuppressive drugs, initial encounter: Secondary | ICD-10-CM | POA: Diagnosis present

## 2014-12-08 DIAGNOSIS — C229 Malignant neoplasm of liver, not specified as primary or secondary: Secondary | ICD-10-CM | POA: Diagnosis present

## 2014-12-08 DIAGNOSIS — Z85038 Personal history of other malignant neoplasm of large intestine: Secondary | ICD-10-CM | POA: Diagnosis not present

## 2014-12-08 DIAGNOSIS — K264 Chronic or unspecified duodenal ulcer with hemorrhage: Secondary | ICD-10-CM | POA: Diagnosis present

## 2014-12-08 DIAGNOSIS — Z6823 Body mass index (BMI) 23.0-23.9, adult: Secondary | ICD-10-CM | POA: Diagnosis not present

## 2014-12-08 DIAGNOSIS — R109 Unspecified abdominal pain: Secondary | ICD-10-CM

## 2014-12-08 DIAGNOSIS — D62 Acute posthemorrhagic anemia: Principal | ICD-10-CM | POA: Diagnosis present

## 2014-12-08 DIAGNOSIS — E43 Unspecified severe protein-calorie malnutrition: Secondary | ICD-10-CM | POA: Diagnosis present

## 2014-12-08 HISTORY — DX: Fracture of orbital floor, unspecified side, initial encounter for closed fracture: S02.30XA

## 2014-12-08 HISTORY — DX: Other pulmonary embolism without acute cor pulmonale: I26.99

## 2014-12-08 HISTORY — PX: ESOPHAGOGASTRODUODENOSCOPY: SHX5428

## 2014-12-08 LAB — CBC
HEMATOCRIT: 22.7 % — AB (ref 39.0–52.0)
Hemoglobin: 7.6 g/dL — ABNORMAL LOW (ref 13.0–17.0)
MCH: 28.5 pg (ref 26.0–34.0)
MCHC: 33.5 g/dL (ref 30.0–36.0)
MCV: 85 fL (ref 78.0–100.0)
Platelets: 68 10*3/uL — ABNORMAL LOW (ref 150–400)
RBC: 2.67 MIL/uL — AB (ref 4.22–5.81)
RDW: 14.6 % (ref 11.5–15.5)
WBC: 0.5 10*3/uL — CL (ref 4.0–10.5)

## 2014-12-08 LAB — I-STAT CG4 LACTIC ACID, ED: Lactic Acid, Venous: 1.41 mmol/L (ref 0.5–2.2)

## 2014-12-08 LAB — COMPREHENSIVE METABOLIC PANEL
ALBUMIN: 2.8 g/dL — AB (ref 3.5–5.2)
ALT: 16 U/L (ref 0–53)
ANION GAP: 12 (ref 5–15)
AST: 22 U/L (ref 0–37)
Alkaline Phosphatase: 291 U/L — ABNORMAL HIGH (ref 39–117)
BUN: 18 mg/dL (ref 6–23)
CO2: 24 mmol/L (ref 19–32)
Calcium: 7.7 mg/dL — ABNORMAL LOW (ref 8.4–10.5)
Chloride: 111 mEq/L (ref 96–112)
Creatinine, Ser: 0.86 mg/dL (ref 0.50–1.35)
GFR calc Af Amer: >90 mL/min (ref >90)
GFR calc non Af Amer: >90 mL/min (ref >90)
Glucose, Bld: 110 mg/dL — ABNORMAL HIGH (ref 70–99)
Sodium: 147 mmol/L — ABNORMAL HIGH (ref 135–145)
TOTAL PROTEIN: 7.2 g/dL (ref 6.0–8.3)
Total Bilirubin: 1 mg/dL (ref 0.3–1.2)

## 2014-12-08 LAB — CBC WITH DIFFERENTIAL/PLATELET
Basophils Absolute: 0 10*3/uL (ref 0.0–0.1)
Basophils Relative: 2 % — ABNORMAL HIGH (ref 0–1)
EOS PCT: 2 % (ref 0–5)
Eosinophils Absolute: 0 10*3/uL (ref 0.0–0.7)
HCT: 15.4 % — ABNORMAL LOW (ref 39.0–52.0)
Hemoglobin: 4.9 g/dL — CL (ref 13.0–17.0)
LYMPHS PCT: 78 % — AB (ref 12–46)
Lymphs Abs: 0.5 10*3/uL — ABNORMAL LOW (ref 0.7–4.0)
MCH: 26.9 pg (ref 26.0–34.0)
MCHC: 31.8 g/dL (ref 30.0–36.0)
MCV: 84.6 fL (ref 78.0–100.0)
MONOS PCT: 16 % — AB (ref 3–12)
Monocytes Absolute: 0.1 10*3/uL (ref 0.1–1.0)
NEUTROS PCT: 2 % — AB (ref 43–77)
Neutro Abs: 0 10*3/uL — ABNORMAL LOW (ref 1.7–7.7)
PLATELETS: 43 10*3/uL — AB (ref 150–400)
RBC: 1.82 MIL/uL — AB (ref 4.22–5.81)
RDW: 16 % — ABNORMAL HIGH (ref 11.5–15.5)
WBC: 0.6 10*3/uL — AB (ref 4.0–10.5)

## 2014-12-08 LAB — POC OCCULT BLOOD, ED: Fecal Occult Bld: POSITIVE — AB

## 2014-12-08 LAB — PROTIME-INR
INR: 1.09 (ref 0.00–1.49)
PROTHROMBIN TIME: 14.2 s (ref 11.6–15.2)

## 2014-12-08 LAB — MAGNESIUM: Magnesium: 1.2 mg/dL — ABNORMAL LOW (ref 1.5–2.5)

## 2014-12-08 LAB — LIPASE, BLOOD: Lipase: 10 U/L — ABNORMAL LOW (ref 11–59)

## 2014-12-08 LAB — PREPARE RBC (CROSSMATCH)

## 2014-12-08 LAB — MRSA PCR SCREENING: MRSA BY PCR: NEGATIVE

## 2014-12-08 LAB — ABO/RH: ABO/RH(D): O POS

## 2014-12-08 SURGERY — EGD (ESOPHAGOGASTRODUODENOSCOPY)
Anesthesia: Moderate Sedation

## 2014-12-08 MED ORDER — MORPHINE SULFATE 4 MG/ML IJ SOLN
4.0000 mg | INTRAMUSCULAR | Status: DC | PRN
  Administered 2014-12-08: 4 mg via INTRAVENOUS
  Filled 2014-12-08: qty 1

## 2014-12-08 MED ORDER — MORPHINE SULFATE 4 MG/ML IJ SOLN
6.0000 mg | Freq: Once | INTRAMUSCULAR | Status: AC
  Administered 2014-12-08: 6 mg via INTRAVENOUS
  Filled 2014-12-08: qty 2

## 2014-12-08 MED ORDER — MAGNESIUM SULFATE 2 GM/50ML IV SOLN
2.0000 g | Freq: Once | INTRAVENOUS | Status: AC
  Administered 2014-12-08: 2 g via INTRAVENOUS
  Filled 2014-12-08: qty 50

## 2014-12-08 MED ORDER — SODIUM CHLORIDE 0.9 % IV BOLUS (SEPSIS)
1000.0000 mL | Freq: Once | INTRAVENOUS | Status: AC
Start: 2014-12-08 — End: 2014-12-08
  Administered 2014-12-08: 1000 mL via INTRAVENOUS

## 2014-12-08 MED ORDER — IOHEXOL 300 MG/ML  SOLN
50.0000 mL | Freq: Once | INTRAMUSCULAR | Status: AC | PRN
  Administered 2014-12-08: 50 mL via ORAL

## 2014-12-08 MED ORDER — BUTAMBEN-TETRACAINE-BENZOCAINE 2-2-14 % EX AERO
INHALATION_SPRAY | CUTANEOUS | Status: DC | PRN
Start: 2014-12-08 — End: 2014-12-08
  Administered 2014-12-08: 2 via TOPICAL

## 2014-12-08 MED ORDER — SODIUM CHLORIDE 0.9 % IV SOLN
INTRAVENOUS | Status: DC
  Administered 2014-12-08 – 2014-12-10 (×3): via INTRAVENOUS
  Filled 2014-12-08 (×6): qty 1000

## 2014-12-08 MED ORDER — PANTOPRAZOLE SODIUM 40 MG IV SOLR
80.0000 mg | Freq: Once | INTRAVENOUS | Status: AC
  Administered 2014-12-08: 80 mg via INTRAVENOUS
  Filled 2014-12-08: qty 80

## 2014-12-08 MED ORDER — TRAZODONE HCL 50 MG PO TABS
50.0000 mg | ORAL_TABLET | Freq: Every day | ORAL | Status: DC
  Administered 2014-12-09 – 2014-12-10 (×3): 50 mg via ORAL
  Filled 2014-12-08 (×4): qty 1

## 2014-12-08 MED ORDER — FENTANYL CITRATE 0.05 MG/ML IJ SOLN
INTRAMUSCULAR | Status: AC
  Filled 2014-12-08: qty 2

## 2014-12-08 MED ORDER — OCTREOTIDE ACETATE 50 MCG/ML IJ SOLN
50.0000 ug | Freq: Once | INTRAMUSCULAR | Status: AC
  Administered 2014-12-08: 50 ug via INTRAVENOUS
  Filled 2014-12-08: qty 1

## 2014-12-08 MED ORDER — SODIUM CHLORIDE 0.9 % IV SOLN
80.0000 mg | Freq: Once | INTRAVENOUS | Status: DC
  Filled 2014-12-08: qty 80

## 2014-12-08 MED ORDER — MIDAZOLAM HCL 10 MG/2ML IJ SOLN
INTRAMUSCULAR | Status: DC | PRN
  Administered 2014-12-08 (×2): 2 mg via INTRAVENOUS

## 2014-12-08 MED ORDER — IOHEXOL 300 MG/ML  SOLN
100.0000 mL | Freq: Once | INTRAMUSCULAR | Status: AC | PRN
  Administered 2014-12-08: 100 mL via INTRAVENOUS

## 2014-12-08 MED ORDER — SODIUM CHLORIDE 0.9 % IV SOLN
8.0000 mg/h | INTRAVENOUS | Status: DC
  Administered 2014-12-08 – 2014-12-09 (×3): 8 mg/h via INTRAVENOUS
  Filled 2014-12-08 (×6): qty 80

## 2014-12-08 MED ORDER — SODIUM CHLORIDE 0.9 % IV SOLN
50.0000 ug/h | INTRAVENOUS | Status: DC
  Administered 2014-12-08: 50 ug/h via INTRAVENOUS
  Filled 2014-12-08 (×2): qty 1

## 2014-12-08 MED ORDER — VITAMIN B-12 100 MCG PO TABS
100.0000 ug | ORAL_TABLET | Freq: Every day | ORAL | Status: DC
  Administered 2014-12-09 – 2014-12-11 (×3): 100 ug via ORAL
  Filled 2014-12-08 (×3): qty 1

## 2014-12-08 MED ORDER — HYDROMORPHONE HCL 2 MG/ML IJ SOLN
2.0000 mg | INTRAMUSCULAR | Status: DC | PRN
  Administered 2014-12-08: 3 mg via INTRAVENOUS
  Administered 2014-12-09 (×3): 2 mg via INTRAVENOUS
  Administered 2014-12-09: 3 mg via INTRAVENOUS
  Administered 2014-12-10 – 2014-12-11 (×2): 2 mg via INTRAVENOUS
  Filled 2014-12-08: qty 2
  Filled 2014-12-08: qty 1
  Filled 2014-12-08: qty 2
  Filled 2014-12-08 (×7): qty 1

## 2014-12-08 MED ORDER — FENTANYL CITRATE 0.05 MG/ML IJ SOLN
INTRAMUSCULAR | Status: DC | PRN
  Administered 2014-12-08 (×2): 25 ug via INTRAVENOUS

## 2014-12-08 MED ORDER — DEXTROSE 5 % IV SOLN
2.0000 g | Freq: Three times a day (TID) | INTRAVENOUS | Status: DC
  Administered 2014-12-08: 2 g via INTRAVENOUS
  Filled 2014-12-08 (×2): qty 2

## 2014-12-08 MED ORDER — MIDAZOLAM HCL 10 MG/2ML IJ SOLN
INTRAMUSCULAR | Status: AC
Start: 2014-12-08 — End: 2014-12-08
  Filled 2014-12-08: qty 2

## 2014-12-08 MED ORDER — DEXTROSE 5 % IV SOLN
1.0000 g | Freq: Once | INTRAVENOUS | Status: AC
  Administered 2014-12-08: 1 g via INTRAVENOUS
  Filled 2014-12-08: qty 10

## 2014-12-08 MED ORDER — SERTRALINE HCL 100 MG PO TABS
100.0000 mg | ORAL_TABLET | Freq: Every day | ORAL | Status: DC
  Administered 2014-12-09 – 2014-12-11 (×3): 100 mg via ORAL
  Filled 2014-12-08 (×3): qty 1

## 2014-12-08 MED ORDER — POTASSIUM CHLORIDE 10 MEQ/100ML IV SOLN
10.0000 meq | INTRAVENOUS | Status: AC
  Administered 2014-12-08 (×6): 10 meq via INTRAVENOUS
  Filled 2014-12-08 (×6): qty 100

## 2014-12-08 MED ORDER — HYDROMORPHONE HCL 2 MG/ML IJ SOLN
INTRAMUSCULAR | Status: AC
  Administered 2014-12-08: 2 mg
  Filled 2014-12-08: qty 1

## 2014-12-08 MED ORDER — MORPHINE SULFATE 4 MG/ML IJ SOLN
4.0000 mg | INTRAMUSCULAR | Status: DC | PRN
Start: 2014-12-08 — End: 2014-12-08
  Administered 2014-12-08: 4 mg via INTRAVENOUS
  Filled 2014-12-08: qty 1

## 2014-12-08 MED ORDER — FILGRASTIM 480 MCG/1.6ML IJ SOLN
480.0000 ug | Freq: Every day | INTRAVENOUS | Status: DC
  Administered 2014-12-08 – 2014-12-09 (×2): 480 ug via INTRAVENOUS
  Filled 2014-12-08 (×3): qty 1.6

## 2014-12-08 NOTE — ED Notes (Signed)
Pt presents d/t black tarry stools x 5 since 0000.  Pt also c/o diffuse abdominal pain 8/10, sharp in nature.

## 2014-12-08 NOTE — H&P (Signed)
[facility] History and Physical  Nicholas Ayala WCH:[id number] DOB: 1961-07-19 DOA: 12/08/2014  Referring physician: EDP PCP: PROVIDER NOT IN SYSTEM   Chief Complaint: GI bleed   HPI: Nicholas Ayala is a 53 y.o. male who presents to the ED with c/o bloody stools.  He had been having melanotic stools for the past 3 days, but didn't think anything of it until today when it changed to BRBPR.  He also complains of fatigue, SOB.  He does have diffuse abdominal pain which he associates with his liver cancer but denies this being any worse or changed.  4 bloody BMs today, no hematemesis or vomiting at all.  No known history of portal hypertension or varicies but patient does have liver cancer for which he is being treated at [facility], has had chemotherapy with this recently.  Review of Systems: Systems reviewed.  As above, otherwise negative  Past Medical History  Diagnosis Date  . Prostate cancer 06/2010    Gleason 3=+4=7  . Hx of radiation therapy 06/15/11 to 08/10/11    prostatic fossa   Past Surgical History  Procedure Laterality Date  . Shoulder arthroscopy      right  . Prostatectomy  11/12/2010    Gleason 3+4=7  . Shoulder arthroscopy      right shoulder  . Orif orbital fracture Left 03/02/2014    Procedure: OPEN TREATMENT LEFT ORBITAL FLOOR WITH IMPLANT ;  Surgeon: Irene Limbo, MD;  Location: [place];  Service: Plastics;  Laterality: Left;  . Hematoma evacuation Left 03/05/2014    Procedure: EVACUATION HEMATOMA;  Surgeon: Irene Limbo, MD;  Location: [place];  Service: Plastics;  Laterality: Left;   Social History:  reports that he quit smoking about 9 months ago. His smoking use included Cigarettes. He has a 8.5 pack-year smoking history. He does not have any smokeless tobacco history on file. He reports that he does not drink alcohol or use illicit drugs.  No Known Allergies  Family History  Problem Relation Age of Onset  . Cancer Father     throat?     Prior to  Admission medications   Medication Sig Start Date End Date Taking? Authorizing Provider  artificial tears (LACRILUBE) OINT ophthalmic ointment Place into the left eye every 2 (two) hours. 03/10/14   Lisette Abu, PA-C  aspirin EC 81 MG tablet Take 81 mg by mouth daily.    Historical Provider, MD  bicalutamide (CASODEX) 50 MG tablet Take 50 mg by mouth daily.    Historical Provider, MD  calcium-vitamin D (OSCAL) 250-125 MG-UNIT per tablet Take 1 tablet by mouth 2 (two) times daily.    Historical Provider, MD  citalopram (CELEXA) 10 MG tablet Take 10 mg by mouth daily.    Historical Provider, MD  dorzolamide-timolol (COSOPT) 22.3-6.8 MG/ML ophthalmic solution Place 1 drop into the left eye 2 (two) times daily. 03/10/14   Lisette Abu, PA-C  enoxaparin (LOVENOX) 80 MG/0.8ML injection Inject 0.8 mLs (80 mg total) into the skin every 12 (twelve) hours. 03/10/14   Lisette Abu, PA-C  ketorolac (ACULAR) 0.5 % ophthalmic solution Place 1 drop into the right eye 4 (four) times daily. 03/10/14   Lisette Abu, PA-C  leuprolide (LUPRON) 30 MG injection Inject 30 mg into the muscle every 6 (six) months.    Historical Provider, MD  methylPREDNIsolone (MEDROL DOSPACK) 4 MG tablet follow package directions 03/10/14   Lisette Abu, PA-C  oxyCODONE (OXY IR/ROXICODONE) 5 MG immediate release tablet  Take 5-10 mg by mouth every 6 (six) hours as needed (shoulder pain).    Historical Provider, MD  prednisoLONE acetate (PRED FORTE) 1 % ophthalmic suspension Place 1 drop into the left eye 2 (two) times daily. 03/10/14   Lisette Abu, PA-C  traMADol (ULTRAM) 50 MG tablet Take 1-2 tablets (50-100 mg total) by mouth every 6 (six) hours as needed (Pain). 03/10/14   Lisette Abu, PA-C  traZODone (DESYREL) 50 MG tablet Take 50 mg by mouth at bedtime.    Historical Provider, MD  warfarin (COUMADIN) 5 MG tablet Take 1 tablet (5 mg total) by mouth daily at 6 PM. Or as directed 03/10/14   Lisette Abu,  PA-C   Physical Exam: Filed Vitals:   12/08/14 0354  BP: 119/58  Pulse: 67  Temp: 99.8 F (37.7 C)  Resp: 18    BP 119/58 mmHg  Pulse 67  Temp(Src) 99.8 F (37.7 C) (Oral)  Resp 18  Ht 5\' 7"  (1.702 m)  Wt 68.04 kg (150 lb)  BMI 23.49 kg/m2  SpO2 100%  General Appearance:    Alert, oriented, no distress, appears stated age  Head:    Normocephalic, atraumatic  Eyes:    PERRL, EOMI, sclera pale       Nose:   Nares without drainage or epistaxis. Mucosa, turbinates normal  Throat:   Moist mucous membranes. Oropharynx without erythema or exudate.  Neck:   Supple. No carotid bruits.  No thyromegaly.  No lymphadenopathy.   Back:     No CVA tenderness, no spinal tenderness  Lungs:     Clear to auscultation bilaterally, without wheezes, rhonchi or rales  Chest wall:    No tenderness to palpitation  Heart:    Regular rate and rhythm without murmurs, gallops, rubs  Abdomen:     Soft, TTP, nondistended, normal bowel sounds, Hepatomegally  Genitalia:    deferred  Rectal:    deferred  Extremities:   No clubbing, cyanosis or edema.  Pulses:   2+ and symmetric all extremities  Skin:   Skin color, texture, turgor normal, no rashes or lesions  Lymph nodes:   Cervical, supraclavicular, and axillary nodes normal  Neurologic:   CNII-XII intact. Normal strength, sensation and reflexes      throughout    Labs on Admission:  Basic Metabolic Panel:  Recent Labs Lab 12/08/14 0446  NA 147*  K PENDING  CL 111  CO2 24  GLUCOSE 110*  BUN 18  CREATININE 0.86  CALCIUM 7.7*   Liver Function Tests:  Recent Labs Lab 12/08/14 0446  AST 22  ALT 16  ALKPHOS 291*  BILITOT 1.0  PROT 7.2  ALBUMIN 2.8*    Recent Labs Lab 12/08/14 0446  LIPASE 10*   No results for input(s): AMMONIA in the last 168 hours. CBC:  Recent Labs Lab 12/08/14 0446  WBC 0.6*  NEUTROABS PENDING  HGB 4.9*  HCT 15.4*  MCV 84.6  PLT 43*   Cardiac Enzymes: No results for input(s): CKTOTAL, CKMB,  CKMBINDEX, TROPONINI in the last 168 hours.  BNP (last 3 results) No results for input(s): PROBNP in the last 8760 hours. CBG: No results for input(s): GLUCAP in the last 168 hours.  Radiological Exams on Admission: No results found.  EKG: Independently reviewed.  Assessment/Plan Principal Problem:   GI bleed Active Problems:   Iliac artery aneurysm, right   Acute blood loss anemia   Cancer of liver   Thrombocytopenia   Leukopenia  1. GI bleed causing acute blood loss anemia - must assume varicial source given presence of HCC,  1. Illiac artery aneurysm source seems less likely given the melena, and the fact that the patient has stable vital signs in ED. 2. Octreotide 3. protonix bolus and infusion 4. PRBC, 2 units to transfuse 5. 1 unit of platelets to transfuse ordered by EDP 6. INR normal, verified that patient is not on any other anticoagulants at home (lovenox on med rec is from March of this year and he has not been on this for several months). 7. Dr. Deatra Ina has been called and is on his way in to see patient and scope. 8. Repeat CBC at noon. 2. Thrombocytopenia - 1 unit ordered for transfuse, repeat CBC at noon, not clear how much of this is due to blood loss, how much due to liver cancer, and how much due to chemo. 3. Leukopenia - due to chemo and blood loss, continue to monitor while here    Code Status: Full Code  Family Communication: Family at bedside Disposition Plan: Admit to SDU   Time spent: 70 min  Total critical care time: 30 min  Critical care time was exclusive of separately billable procedures and treating other patients.  Critical care was necessary to treat or prevent imminent or life-threatening deterioration.  Critical care was time spent personally by me on the following activities: development of treatment plan with patient and/or surrogate as well as nursing, discussions with consultants, evaluation of patient's response to treatment,  examination of patient, obtaining history from patient or surrogate, ordering and performing treatments and interventions, ordering and review of laboratory studies, ordering and review of radiographic studies, pulse oximetry and re-evaluation of patient's condition.   GARDNER, JARED M. [facility] Pager [phone]  If 7AM-7PM, please contact the day team taking care of the patient [email or web address] Password [facility] 12/08/2014, 5:57 AM

## 2014-12-08 NOTE — Progress Notes (Signed)
ANTIBIOTIC CONSULT NOTE - INITIAL  Pharmacy Consult for cefepime Indication: febrile neutropenia  No Known Allergies  Patient Measurements: Height: 5\' 7"  (170.2 cm) Weight: 150 lb (68.04 kg) IBW/kg (Calculated) : 66.1   Vital Signs: Temp: 98.9 F (37.2 C) (12/26 1100) Temp Source: Oral (12/26 1100) BP: 142/84 mmHg (12/26 1100) Pulse Rate: 65 (12/26 1100) Intake/Output from previous day: 12/25 0701 - 12/26 0700 In: 157.9 [I.V.:27.9; Blood:30; IV Piggyback:100] Out: 150 [Urine:150] Intake/Output from this shift: Total I/O In: 1010 [I.V.:150; Blood:660; IV Piggyback:200] Out: 150 [Urine:150]  Labs:  Recent Labs  12/08/14 0446  WBC 0.6*  HGB 4.9*  PLT 43*  CREATININE 0.86   Estimated Creatinine Clearance: 90.7 mL/min (by C-G formula based on Cr of 0.86). No results for input(s): VANCOTROUGH, VANCOPEAK, VANCORANDOM, GENTTROUGH, GENTPEAK, GENTRANDOM, TOBRATROUGH, TOBRAPEAK, TOBRARND, AMIKACINPEAK, AMIKACINTROU, AMIKACIN in the last 72 hours.   Microbiology: Recent Results (from the past 720 hour(s))  MRSA PCR Screening     Status: None   Collection Time: 12/08/14  6:39 AM  Result Value Ref Range Status   MRSA by PCR NEGATIVE NEGATIVE Final    Comment:        The GeneXpert MRSA Assay (FDA approved for NASAL specimens only), is one component of a comprehensive MRSA colonization surveillance program. It is not intended to diagnose MRSA infection nor to guide or monitor treatment for MRSA infections.     Medical History: Past Medical History  Diagnosis Date  . Prostate cancer 06/2010    Gleason 3=+4=7  . Hx of radiation therapy 06/15/11 to 08/10/11    prostatic fossa     Assessment: 53 y.o. male with PMH of prostate cancer s/p prostatectomy and radiation, recently diagnosed with liver and colon cancer at the [place].  Had first chemo treatment 1.5 weeks ago.  Platelets are 43 and WBC count is 0.6 (ANC is 0)  Pharmacy consulted to dose cefepime for febrile  neutropenia.  Goal of Therapy:  Cefepime per renal function  Plan:   Cefepime 2mg  IV q8h  Follow renal function/cultures/clinical course  Dolly Rias RPh 12/08/2014, 11:22 AM Pager [phone]
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
## 2014-12-08 NOTE — Consult Note (Signed)
Referring Provider: No ref. provider found Primary Care Physician:  PROVIDER NOT IN SYSTEM Primary Gastroenterologist:  Althia Forts  Reason for Consultation:  GI bleeding  HPI: [name] is a 53 y.o. male with PMH of prostate cancer s/p prostatectomy and radiation, and history of PE's earlier this year now (previously on coumadin and lovenox not those have been discontinued).  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  Had first chemo treatment 1.5 weeks ago.  Presented to [facility] hospital with complaints of black stools that turned to bloody stools early this morning.   Upon evaluation in the ED Hgb was 4.9 grams compared to 12.1 grams 9 months ago.  Platelets are 43 and WBC count is 0.6 (ANC is 0).   Potassium <2.0.   Past Medical History  Diagnosis Date  . Prostate cancer 06/2010    Gleason 3=+4=7  . Hx of radiation therapy 06/15/11 to 08/10/11    prostatic fossa    Past Surgical History  Procedure Laterality Date  . Shoulder arthroscopy      right  . Prostatectomy  11/12/2010    Gleason 3+4=7  . Shoulder arthroscopy      right shoulder  . Orif orbital fracture Left 03/02/2014    Procedure: OPEN TREATMENT LEFT ORBITAL FLOOR WITH IMPLANT ;  Surgeon: Irene Limbo, MD;  Location: [place];  Service: Plastics;  Laterality: Left;  . Hematoma evacuation Left 03/05/2014    Procedure: EVACUATION HEMATOMA;  Surgeon: Irene Limbo, MD;  Location: [place];  Service: Plastics;  Laterality: Left;    Prior to Admission medications   Medication Sig Start Date End Date Taking? Authorizing Provider  allopurinol (ZYLOPRIM) 300 MG tablet Take 300 mg by mouth daily.   Yes Historical Provider, MD  aspirin EC 81 MG tablet Take 81 mg by mouth daily.   Yes Historical Provider, MD  atorvastatin (LIPITOR) 80 MG tablet Take 80 mg by mouth daily.   Yes Historical Provider, MD    calcium-vitamin D (OSCAL) 250-125 MG-UNIT per tablet Take 1 tablet by mouth 2 (two) times daily.   Yes Historical Provider, MD  cyanocobalamin 1000 MCG tablet Take 100 mcg by mouth daily.   Yes Historical Provider, MD  leuprolide, 6 Month, (ELIGARD) 45 MG injection Inject 45 mg into the skin every 6 (six) months.   Yes Historical Provider, MD  loperamide (IMODIUM) 2 MG capsule Take 2 mg by mouth every 6 (six) hours as needed for diarrhea or loose stools.   Yes Historical Provider, MD  sertraline (ZOLOFT) 100 MG tablet Take 100 mg by mouth daily.   Yes Historical Provider, MD  sildenafil (VIAGRA) 100 MG tablet Take 100 mg by mouth daily as needed for erectile dysfunction.   Yes Historical Provider, MD  traZODone (DESYREL) 50 MG tablet Take 50 mg by mouth at bedtime.   Yes Historical Provider, MD  artificial tears (LACRILUBE) OINT ophthalmic ointment Place into the left eye every 2 (two) hours. 03/10/14   Lisette Abu, PA-C  bicalutamide (CASODEX) 50 MG tablet Take 50 mg by mouth daily.    Historical Provider, MD  citalopram (CELEXA) 10 MG tablet Take 10 mg by mouth daily.    Historical Provider, MD  dorzolamide-timolol (COSOPT) 22.3-6.8 MG/ML ophthalmic solution Place 1 drop into the left eye 2 (two) times daily. 03/10/14   Lisette Abu, PA-C  enoxaparin (LOVENOX) 80 MG/0.8ML  injection Inject 0.8 mLs (80 mg total) into the skin every 12 (twelve) hours. 03/10/14   Lisette Abu, PA-C  ketorolac (ACULAR) 0.5 % ophthalmic solution Place 1 drop into the right eye 4 (four) times daily. 03/10/14   Lisette Abu, PA-C  leuprolide (LUPRON) 30 MG injection Inject 30 mg into the muscle every 6 (six) months.    Historical Provider, MD  methylPREDNIsolone (MEDROL DOSPACK) 4 MG tablet follow package directions Patient not taking: Reported on 12/08/2014 03/10/14   Lisette Abu, PA-C  oxyCODONE (OXY IR/ROXICODONE) 5 MG immediate release tablet Take 5-10 mg by mouth every 6 (six) hours as needed  (shoulder pain).    Historical Provider, MD  prednisoLONE acetate (PRED FORTE) 1 % ophthalmic suspension Place 1 drop into the left eye 2 (two) times daily. Patient not taking: Reported on 12/08/2014 03/10/14   Lisette Abu, PA-C  traMADol (ULTRAM) 50 MG tablet Take 1-2 tablets (50-100 mg total) by mouth every 6 (six) hours as needed (Pain). Patient not taking: Reported on 12/08/2014 03/10/14   Lisette Abu, PA-C  warfarin (COUMADIN) 5 MG tablet Take 1 tablet (5 mg total) by mouth daily at 6 PM. Or as directed Patient not taking: Reported on 12/08/2014 03/10/14   Lisette Abu, PA-C    Current Facility-Administered Medications  Medication Dose Route Frequency Provider Last Rate Last Dose  . morphine 4 MG/ML injection 4 mg  4 mg Intravenous Q4H PRN Etta Quill, DO      . octreotide (SANDOSTATIN) 500 mcg in sodium chloride 0.9 % 250 mL (2 mcg/mL) infusion  50 mcg/hr Intravenous Continuous Everlene Balls, MD 25 mL/hr at 12/08/14 0553 50 mcg/hr at 12/08/14 0553  . pantoprazole (PROTONIX) 80 mg in sodium chloride 0.9 % 250 mL (0.32 mg/mL) infusion  8 mg/hr Intravenous Continuous Everlene Balls, MD 25 mL/hr at 12/08/14 0700 8 mg/hr at 12/08/14 0700  . potassium chloride 10 mEq in 100 mL IVPB  10 mEq Intravenous Q1 Hr x 6 Dianne Dun, NP   10 mEq at 12/08/14 0755    Allergies as of 12/08/2014  . (No Known Allergies)    Family History  Problem Relation Age of Onset  . Cancer Father     throat?    History   Social History  . Marital Status: Legally Separated    Spouse Name: N/A    Number of Children: N/A  . Years of Education: N/A   Occupational History  . Not on file.   Social History Main Topics  . Smoking status: Former Smoker -- 0.25 packs/day for 34 years    Types: Cigarettes    Quit date: 02/27/2014  . Smokeless tobacco: Not on file     Comment: 4 cigs /week  . Alcohol Use: No     Comment: weekends  . Drug Use: No  . Sexual Activity: No   Other  Topics Concern  . Not on file   Social History Narrative    Review of Systems: Ten point ROS is O/W negative except as mentioned in HPI.  Physical Exam: Vital signs in last 24 hours: Temp:  [99.5 F (37.5 C)-99.8 F (37.7 C)] 99.5 F (37.5 C) (12/26 0700) Pulse Rate:  [65-71] 68 (12/26 0800) Resp:  [10-18] 10 (12/26 0800) BP: (119-151)/(58-78) 151/78 mmHg (12/26 0800) SpO2:  [99 %-100 %] 100 % (12/26 0800) Weight:  [150 lb (68.04 kg)] 150 lb (68.04 kg) (12/26 0630)   General:  Alert,  Well-developed, well-nourished,  pleasant and cooperative in NAD Head:  Normocephalic and atraumatic. Eyes:  Sclera clear, no icterus.   Conjunctiva pink. Ears:  Normal auditory acuity. Mouth:  No deformity or lesions.  Lungs:  Clear throughout to auscultation.   No wheezes, crackles, or rhonchi.  Heart:  Regular rate and rhythm; no murmurs, clicks, rubs,  or gallops. Abdomen:  Soft,nontender, BS active,nonpalp mass or hsm.   Rectal:  Deferred  Msk:  Symmetrical without gross deformities. Pulses:  Normal pulses noted. Extremities:  Without clubbing or edema. Neurologic:  Alert and  oriented x4;  grossly normal neurologically. Skin:  Intact without significant lesions or rashes. Psych:  Alert and cooperative. Normal mood and affect.  Intake/Output from previous day: 12/25 0701 - 12/26 0700 In: 30 [Blood:30] Out: 150 [Urine:150]  Lab Results:  Recent Labs  12/08/14 0446  WBC 0.6*  HGB 4.9*  HCT 15.4*  PLT 43*   BMET  Recent Labs  12/08/14 0446  NA 147*  K <2.0*  CL 111  CO2 24  GLUCOSE 110*  BUN 18  CREATININE 0.86  CALCIUM 7.7*   LFT  Recent Labs  12/08/14 0446  PROT 7.2  ALBUMIN 2.8*  AST 22  ALT 16  ALKPHOS 291*  BILITOT 1.0   PT/INR  Recent Labs  12/08/14 0446  LABPROT 14.2  INR 1.09   IMPRESSION:  -Gastrointestinal bleeding:  Suspect UGI source although BUN not elevated and recent questionable diagnosis of colon cancer.  No known  cirrhosis.  PLAN: -Agree with transfusion of platelets and PRBC's.  Also correction of potassium. -Agree with PPI gtt, octreotide gtt, and Rocephin for now although no known cirrhosis. -EGD later today. -It would be helpful to try and obtain records from the [place].  ZEHR, JESSICA D.  12/08/2014, 8:41 AM  Pager number [phone]  GI Attending Note   Chart was reviewed and patient was examined. X-rays and lab were reviewed.   She has been an acute GI bleed.  Unclear whether this is an upper GI bleed or lower.  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  Cirrhosis was not seen on prior CT from March, 2015.  He is immunosuppressed from chemotherapy.  Agree with broad-spectrum antibiotics and high-dose PPI therapy.  If no varices are seen octreotide can be discontinued.  Plan upper endoscopy following transfusions with platelets and packed RBCs.  Sandy Salaam. Deatra Ina, M.D., [facility] Cell [phone]

## 2014-12-08 NOTE — Progress Notes (Addendum)
Patient ID: Nicholas Ayala, male   DOB: 1960/12/15, 53 y.o.   MRN: [id number] [facility] PROGRESS NOTE  Nicholas Ayala [facility]:[id number] DOB: Apr 26, 1961 DOA: 12/08/2014 PCP: PROVIDER NOT IN SYSTEM  Brief narrative:    53 y.o. male with history of prostate cancer, status post prostatectomy , radiation therapy, recent diagnosis of liver and colon cancer (follows at [place]) ?neuroendocrine tumor, has had recent chemotherapy about one and a half week ago, history of PE (was on coumadin and Lovenox but that was stopped). Patient presented to [facility] ED with reports of melenotic and bloody stools started few days prior to this admission. Patient also reported abdominal pain but no vomiting. In ED, BP was 119/58, HR 65, RR 15, T max 99.8 F and oxygen saturation 99% on room air. Blood work showed WBC count 0.8, hemoglobin 4.9, platelet count 43, potassium less than 2 and INR 1.09. Gastroenterology has seen the patient in consultation with recommendations to continue PPI gtt and octreotide gtt, FFP and blood transfusion and EGD 12/08/2014.    Assessment/Plan:    Principal Problem: Acute blood loss anemia /  GI bleed /  Anemia of chronic disease  Likely GI bleed. Appreciate GI consult and recommendations.  Continue octreotide and protonix drip.  Transfuse PRBC and FFP. So far has received 1 unit of PRB but ongoing transfusion at this time.   Plan for endoscopy later on today.  Monitor in SDU.  Active Problems: Abdominal pain  Likely due to history of liver and colon cancer. Would obtain CT abdomen for further evaluation.  Neutropenic fever  Low grade fever in immunocompromised pt with recent chemotherapy in addition to neutropenia.   Patient started on rocephin but will switch to cefepime for neutropenic fever.   Start neupogen 480 mcg daily.   Antineoplastic chemotherapy induced pancytopenia /Thrombocytopenia  Recent chemotherapy, one and a half week prior to this admission.  Needs  blood transfusion and FFP transfusion.   Monitor CBC daily.  Hypokalemia / hypomagnesemia  Due to GI losses, hypomagnesemia.  Potassium being supplemented through IV fluids.  Magnesium 1.2 on admission. Supplemented with magnesium sulfate on 12/26 2 gm IV.  Follow up BMP, magnesium level in am    Bilateral pulmonary embolism  Not on AC. Pt at high risk of bleed.   Colon cancer, liver cancer / elevated ALP  Follows in [place]. Elevated ALP due to possible bone involvement from cancer.    Malignant neoplasm of prostate  Status post prostatectomy and RT   Protein-calorie malnutrition, severe  In the context of chronic illness  Nutrition consulted     DVT Prophylaxis   SCD's bilaterally due to risk of bleeding  Code Status: Full.  Family Communication:  plan of care discussed with the patient and his wife at the bedside  Disposition Plan: remains in SDU  IV access:  Peripheral IV  Procedures and diagnostic studies:    No results found.  Medical Consultants:  Dr. Erskine Ayala, Gastroenterology  Other Consultants:  Nutrition    IAnti-Infectives:   Rocephin given on admission Cefepime 12/08/2014 -->   Nicholas Lenz, MD  [facility] Pager [phone]  Time spent: 70 minutes  If 7PM-7AM, please contact night-coverage [email or web address] Password [facility] 12/08/2014, 7:33 AM   LOS: 0 days    HPI/Subjective: No acute overnight events.  Objective: Filed Vitals:   12/08/14 0600 12/08/14 0630 12/08/14 0700 12/08/14 0715  BP: 131/66 135/72 148/71 144/75  Pulse: 65   70  Temp:   99.5 F (  37.5 C)   TempSrc:      Resp: 15   12  Height:  5\' 7"  (1.702 m)    Weight:  68.04 kg (150 lb)    SpO2: 99%   100%    Intake/Output Summary (Last 24 hours) at 12/08/14 0733 Last data filed at 12/08/14 0700  Gross per 24 hour  Intake     30 ml  Output      0 ml  Net     30 ml    Exam:   General:  Pt is alert, follows commands appropriately, not in acute  distress  Cardiovascular: Regular rate and rhythm, S1/S2 appreciated   Respiratory: Clear to auscultation bilaterally, no wheezing, no crackles, no rhonchi  Abdomen: tender in mid abdomen, non distended, bowel sounds present  Extremities: pulses DP and PT palpable bilaterally  Neuro: Grossly nonfocal  Data Reviewed: Basic Metabolic Panel:  Recent Labs Lab 12/08/14 0446  NA 147*  K <2.0*  CL 111  CO2 24  GLUCOSE 110*  BUN 18  CREATININE 0.86  CALCIUM 7.7*   Liver Function Tests:  Recent Labs Lab 12/08/14 0446  AST 22  ALT 16  ALKPHOS 291*  BILITOT 1.0  PROT 7.2  ALBUMIN 2.8*    Recent Labs Lab 12/08/14 0446  LIPASE 10*   No results for input(s): AMMONIA in the last 168 hours. CBC:  Recent Labs Lab 12/08/14 0446  WBC 0.6*  NEUTROABS 0.0*  HGB 4.9*  HCT 15.4*  MCV 84.6  PLT 43*   Cardiac Enzymes: No results for input(s): CKTOTAL, CKMB, CKMBINDEX, TROPONINI in the last 168 hours. BNP: Invalid input(s): POCBNP CBG: No results for input(s): GLUCAP in the last 168 hours.  No results found for this or any previous visit (from the past 240 hour(s)).   Scheduled Meds: . potassium chloride  10 mEq Intravenous Q1 Hr x 6   Continuous Infusions: . octreotide  (SANDOSTATIN)    IV infusion 50 mcg/hr (12/08/14 0553)  . pantoprozole (PROTONIX) infusion

## 2014-12-08 NOTE — Progress Notes (Signed)
Upper endoscopy demonstrated multiple mucosal nodules in the apex of the duodenal bulb and in the gastric antrum with ulcerations.  Ulcers were clean-based but I suspect where the source for GI bleeding.  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  Recommendations #1 continue PPI therapy #2 discontinue antibiotics and octreotide #3 attempt to obtain records from the [facility]

## 2014-12-08 NOTE — ED Provider Notes (Signed)
CSN: [id number]     Arrival date & time 12/08/14  0343 History   First MD Initiated Contact with Patient 12/08/14 0405     Chief Complaint  Patient presents with  . GI Bleeding     (Consider location/radiation/quality/duration/timing/severity/associated sxs/prior Treatment) HPI  Nicholas Ayala is a 53 y.o. male with past medical history of liver cancer and newly diagnosed status post chemotherapy coming in with GI bleed. Patient states he saw bright red blood after having a bowel movement at 1 AM. This continued until 3 AM, he told his wife brought him to the emergency department. Upon further questioning patient tells me that he's had dark black stools over the last 3 days. He denies any vomiting. He does have diffuse abdominal pain which is consistent with his liver cancer and he denies this being worse. He denies any worsening abdominal distention. He has no fevers or recent infections. He admits to anemic symptoms such as lightheadedness, shortness of breath, and overall fatigue. He denies this ever occurring in the past. Patient has no further complaints.  10 Systems reviewed and are negative for acute change except as noted in the HPI.      Past Medical History  Diagnosis Date  . Prostate cancer 06/2010    Gleason 3=+4=7  . Hx of radiation therapy 06/15/11 to 08/10/11    prostatic fossa   Past Surgical History  Procedure Laterality Date  . Shoulder arthroscopy      right  . Prostatectomy  11/12/2010    Gleason 3+4=7  . Shoulder arthroscopy      right shoulder  . Orif orbital fracture Left 03/02/2014    Procedure: OPEN TREATMENT LEFT ORBITAL FLOOR WITH IMPLANT ;  Surgeon: Irene Limbo, MD;  Location: [place];  Service: Plastics;  Laterality: Left;  . Hematoma evacuation Left 03/05/2014    Procedure: EVACUATION HEMATOMA;  Surgeon: Irene Limbo, MD;  Location: [place];  Service: Plastics;  Laterality: Left;   Family History  Problem Relation Age of Onset  . Cancer Father      throat?   History  Substance Use Topics  . Smoking status: Former Smoker -- 0.25 packs/day for 34 years    Types: Cigarettes    Quit date: 02/27/2014  . Smokeless tobacco: Not on file     Comment: 4 cigs /week  . Alcohol Use: No     Comment: weekends    Review of Systems    Allergies  Review of patient's allergies indicates no known allergies.  Home Medications   Prior to Admission medications   Medication Sig Start Date End Date Taking? Authorizing Provider  artificial tears (LACRILUBE) OINT ophthalmic ointment Place into the left eye every 2 (two) hours. 03/10/14   Lisette Abu, PA-C  aspirin EC 81 MG tablet Take 81 mg by mouth daily.    Historical Provider, MD  bicalutamide (CASODEX) 50 MG tablet Take 50 mg by mouth daily.    Historical Provider, MD  calcium-vitamin D (OSCAL) 250-125 MG-UNIT per tablet Take 1 tablet by mouth 2 (two) times daily.    Historical Provider, MD  citalopram (CELEXA) 10 MG tablet Take 10 mg by mouth daily.    Historical Provider, MD  dorzolamide-timolol (COSOPT) 22.3-6.8 MG/ML ophthalmic solution Place 1 drop into the left eye 2 (two) times daily. 03/10/14   Lisette Abu, PA-C  enoxaparin (LOVENOX) 80 MG/0.8ML injection Inject 0.8 mLs (80 mg total) into the skin every 12 (twelve) hours. 03/10/14   Christian Mate  Jacqulynn Cadet, PA-C  ketorolac (ACULAR) 0.5 % ophthalmic solution Place 1 drop into the right eye 4 (four) times daily. 03/10/14   Lisette Abu, PA-C  leuprolide (LUPRON) 30 MG injection Inject 30 mg into the muscle every 6 (six) months.    Historical Provider, MD  methylPREDNIsolone (MEDROL DOSPACK) 4 MG tablet follow package directions 03/10/14   Lisette Abu, PA-C  oxyCODONE (OXY IR/ROXICODONE) 5 MG immediate release tablet Take 5-10 mg by mouth every 6 (six) hours as needed (shoulder pain).    Historical Provider, MD  prednisoLONE acetate (PRED FORTE) 1 % ophthalmic suspension Place 1 drop into the left eye 2 (two) times daily.  03/10/14   Lisette Abu, PA-C  traMADol (ULTRAM) 50 MG tablet Take 1-2 tablets (50-100 mg total) by mouth every 6 (six) hours as needed (Pain). 03/10/14   Lisette Abu, PA-C  traZODone (DESYREL) 50 MG tablet Take 50 mg by mouth at bedtime.    Historical Provider, MD  warfarin (COUMADIN) 5 MG tablet Take 1 tablet (5 mg total) by mouth daily at 6 PM. Or as directed 03/10/14   Lisette Abu, PA-C   BP 119/58 mmHg  Pulse 67  Temp(Src) 99.8 F (37.7 C) (Oral)  Resp 18  Ht 5\' 7"  (1.702 m)  Wt 150 lb (68.04 kg)  BMI 23.49 kg/m2  SpO2 100% Physical Exam  Constitutional: He is oriented to person, place, and time. Vital signs are normal. He appears well-developed.  Non-toxic appearance. He does not appear ill. No distress.  HENT:  Head: Normocephalic and atraumatic.  Nose: Nose normal.  Mouth/Throat: Oropharynx is clear and moist. No oropharyngeal exudate.  Eyes: EOM are normal. Pupils are equal, round, and reactive to light. No scleral icterus.  Pale conjunctiva bilaterally  Neck: Normal range of motion. Neck supple. No tracheal deviation, no edema, no erythema and normal range of motion present. No thyroid mass and no thyromegaly present.  Cardiovascular: Normal rate, regular rhythm, S1 normal, S2 normal, normal heart sounds, intact distal pulses and normal pulses.  Exam reveals no gallop and no friction rub.   No murmur heard. Pulses:      Radial pulses are 2+ on the right side, and 2+ on the left side.       Dorsalis pedis pulses are 2+ on the right side, and 2+ on the left side.  Pulmonary/Chest: Effort normal and breath sounds normal. No respiratory distress. He has no wheezes. He has no rhonchi. He has no rales.  Abdominal: Soft. Normal appearance and bowel sounds are normal. He exhibits distension. He exhibits no ascites and no mass. There is no hepatosplenomegaly. There is tenderness. There is no rebound, no guarding and no CVA tenderness.  Hepatomegaly present, tenderness to  palpation on exam.  Musculoskeletal: Normal range of motion. He exhibits no edema or tenderness.  Lymphadenopathy:    He has no cervical adenopathy.  Neurological: He is alert and oriented to person, place, and time. He has normal strength. No cranial nerve deficit or sensory deficit. He exhibits normal muscle tone. GCS eye subscore is 4. GCS verbal subscore is 5. GCS motor subscore is 6.  Skin: Skin is warm, dry and intact. No petechiae and no rash noted. He is not diaphoretic. No erythema. No pallor.  Psychiatric: He has a normal mood and affect. His behavior is normal. Judgment normal.  Nursing note and vitals reviewed.   ED Course  Procedures (including critical care time) Labs Review Labs Reviewed  CBC WITH  DIFFERENTIAL - Abnormal; Notable for the following:    WBC 0.6 (*)    RBC 1.82 (*)    Hemoglobin 4.9 (*)    HCT 15.4 (*)    RDW 16.0 (*)    Platelets 43 (*)    All other components within normal limits  COMPREHENSIVE METABOLIC PANEL - Abnormal; Notable for the following:    Sodium 147 (*)    Glucose, Bld 110 (*)    Calcium 7.7 (*)    Albumin 2.8 (*)    Alkaline Phosphatase 291 (*)    All other components within normal limits  LIPASE, BLOOD - Abnormal; Notable for the following:    Lipase 10 (*)    All other components within normal limits  POC OCCULT BLOOD, ED - Abnormal; Notable for the following:    Fecal Occult Bld POSITIVE (*)    All other components within normal limits  PROTIME-INR  I-STAT CG4 LACTIC ACID, ED  PREPARE RBC (CROSSMATCH)  TYPE AND SCREEN  PREPARE PLATELET PHERESIS    Imaging Review No results found.   EKG Interpretation None      MDM   Final diagnoses:  Gastrointestinal hemorrhage, unspecified gastritis, unspecified gastrointestinal hemorrhage type  Symptomatic anemia  Thrombocytopenia    Patient since emergency department out of concern for GI bleed. With his history of liver cancer, I presume this is new esophageal varices  although this has not been confirmed. Patient was given Protonix and octreotide bolus and was placed on a drip. He was given ceftriaxone as well.  Hemoccult was positive. Hemoglobin is 4.9, platelets 49, white blood cell count 0.6. He will be transfused 2 units of blood and 1 unit of platelets. A second IV has been established.  Patient will be admitted to the stepdown unit, [facility]. I consult with GI who is coming to see the patient emergency department. Patient's hemodynamics remained stable, he has no tachycardia or hypotension throughout the emergency department stay. This leads me to believe that he has been compensating and bleeding for quite some time.  CRITICAL CARE Performed by: Everlene Balls   Total critical care time: 41min  Critical care time was exclusive of separately billable procedures and treating other patients.  Critical care was necessary to treat or prevent imminent or life-threatening deterioration.  Critical care was time spent personally by me on the following activities: development of treatment plan with patient and/or surrogate as well as nursing, discussions with consultants, evaluation of patient's response to treatment, examination of patient, obtaining history from patient or surrogate, ordering and performing treatments and interventions, ordering and review of laboratory studies, ordering and review of radiographic studies, pulse oximetry and re-evaluation of patient's condition.     Everlene Balls, MD 12/08/14 [phone]

## 2014-12-08 NOTE — Progress Notes (Signed)
CRITICAL VALUE ALERT  Critical value received:  Potassium <2.0  Date of notification:  12/08/14  Time of notification:  0625  Critical value read back yes   Nurse who received alert:  Sharyn Lull   MD notified (1st page):  Fredirick Maudlin NP  Time of first page:  0626  MD notified (2nd page): no  Time of second page:no  Responding MD:  Rogue Bussing   Time MD responded:  [phone]

## 2014-12-09 DIAGNOSIS — R109 Unspecified abdominal pain: Secondary | ICD-10-CM

## 2014-12-09 DIAGNOSIS — K250 Acute gastric ulcer with hemorrhage: Secondary | ICD-10-CM

## 2014-12-09 DIAGNOSIS — K264 Chronic or unspecified duodenal ulcer with hemorrhage: Secondary | ICD-10-CM

## 2014-12-09 LAB — COMPREHENSIVE METABOLIC PANEL
ALT: 15 U/L (ref 0–53)
ANION GAP: 9 (ref 5–15)
AST: 18 U/L (ref 0–37)
Albumin: 2.6 g/dL — ABNORMAL LOW (ref 3.5–5.2)
Alkaline Phosphatase: 225 U/L — ABNORMAL HIGH (ref 39–117)
BILIRUBIN TOTAL: 0.9 mg/dL (ref 0.3–1.2)
BUN: 14 mg/dL (ref 6–23)
CHLORIDE: 115 meq/L — AB (ref 96–112)
CO2: 20 mmol/L (ref 19–32)
Calcium: 6.6 mg/dL — ABNORMAL LOW (ref 8.4–10.5)
Creatinine, Ser: 0.91 mg/dL (ref 0.50–1.35)
GFR calc Af Amer: >90 mL/min (ref >90)
GFR calc non Af Amer: >90 mL/min (ref >90)
Glucose, Bld: 132 mg/dL — ABNORMAL HIGH (ref 70–99)
Potassium: 2.2 mmol/L — CL (ref 3.5–5.1)
Sodium: 144 mmol/L (ref 135–145)
Total Protein: 6.1 g/dL (ref 6.0–8.3)

## 2014-12-09 LAB — TYPE AND SCREEN
ABO/RH(D): O POS
Antibody Screen: NEGATIVE
Unit division: 0
Unit division: 0

## 2014-12-09 LAB — PREPARE PLATELET PHERESIS: Unit division: 0

## 2014-12-09 LAB — CBC
HEMATOCRIT: 22.3 % — AB (ref 39.0–52.0)
Hemoglobin: 7.1 g/dL — ABNORMAL LOW (ref 13.0–17.0)
MCH: 27.6 pg (ref 26.0–34.0)
MCHC: 31.8 g/dL (ref 30.0–36.0)
MCV: 86.8 fL (ref 78.0–100.0)
Platelets: 100 10*3/uL — ABNORMAL LOW (ref 150–400)
RBC: 2.57 MIL/uL — ABNORMAL LOW (ref 4.22–5.81)
RDW: 15.3 % (ref 11.5–15.5)
WBC: 0.8 10*3/uL — AB (ref 4.0–10.5)

## 2014-12-09 LAB — MAGNESIUM: MAGNESIUM: 1.3 mg/dL — AB (ref 1.5–2.5)

## 2014-12-09 LAB — CLOSTRIDIUM DIFFICILE BY PCR: Toxigenic C. Difficile by PCR: NEGATIVE

## 2014-12-09 MED ORDER — MAGNESIUM SULFATE 2 GM/50ML IV SOLN
2.0000 g | Freq: Once | INTRAVENOUS | Status: AC
  Administered 2014-12-09: 2 g via INTRAVENOUS
  Filled 2014-12-09: qty 50

## 2014-12-09 MED ORDER — PANTOPRAZOLE SODIUM 40 MG IV SOLR
40.0000 mg | Freq: Two times a day (BID) | INTRAVENOUS | Status: DC
  Administered 2014-12-09 – 2014-12-11 (×5): 40 mg via INTRAVENOUS
  Filled 2014-12-09 (×6): qty 40

## 2014-12-09 MED ORDER — POTASSIUM CHLORIDE 10 MEQ/100ML IV SOLN
10.0000 meq | INTRAVENOUS | Status: AC
  Administered 2014-12-09 (×5): 10 meq via INTRAVENOUS
  Filled 2014-12-09 (×3): qty 100

## 2014-12-09 MED ORDER — POTASSIUM CHLORIDE CRYS ER 20 MEQ PO TBCR
40.0000 meq | EXTENDED_RELEASE_TABLET | Freq: Once | ORAL | Status: AC
  Administered 2014-12-09: 40 meq via ORAL
  Filled 2014-12-09: qty 2

## 2014-12-09 NOTE — Progress Notes (Signed)
Progress Note   Subjective  **No complaints.  No BM overnight.*   Objective  Vital signs in last 24 hours: Temp:  [98 F (36.7 C)-100 F (37.8 C)] 98.2 F (36.8 C) (12/27 0800) Pulse Rate:  [55-81] 64 (12/27 1000) Resp:  [4-25] 8 (12/27 1000) BP: (96-169)/(59-104) 102/66 mmHg (12/27 1000) SpO2:  [91 %-100 %] 97 % (12/27 1000) Last BM Date: 12/08/14 General:   Alert,  Well-developed,   in NAD Heart:  Regular rate and rhythm; no murmurs Abdomen:  Soft, nontender and nondistended. Normal bowel sounds, without guarding, and without rebound.   Extremities:  Without edema. Neurologic:  Alert and  oriented x4;  grossly normal neurologically. Psych:  Alert and cooperative. Normal mood and affect.  Intake/Output from previous day: 12/26 0701 - 12/27 0700 In: 3290.8 [I.V.:1680.8; Blood:935; IV Piggyback:675] Out: 1000 [Urine:1000] Intake/Output this shift: Total I/O In: 514.2 [I.V.:214.2; IV Piggyback:300] Out: 550 [Urine:550]  Lab Results:  Recent Labs  12/08/14 0446 12/08/14 1451 12/09/14 0406  WBC 0.6* 0.5* 0.8*  HGB 4.9* 7.6* 7.1*  HCT 15.4* 22.7* 22.3*  PLT 43* 68* 100*   BMET  Recent Labs  12/08/14 0446 12/09/14 0406  NA 147* 144  K <2.0* 2.2*  CL 111 115*  CO2 24 20  GLUCOSE 110* 132*  BUN 18 14  CREATININE 0.86 0.91  CALCIUM 7.7* 6.6*   LFT  Recent Labs  12/09/14 0406  PROT 6.1  ALBUMIN 2.6*  AST 18  ALT 15  ALKPHOS 225*  BILITOT 0.9   PT/INR  Recent Labs  12/08/14 0446  LABPROT 14.2  INR 1.09   Hepatitis Panel No results for input(s): HEPBSAG, HCVAB, HEPAIGM, HEPBIGM in the last 72 hours.  Studies/Results: Ct Abdomen Pelvis W Contrast  12/08/2014   CLINICAL DATA:  Progressively worsening melena. History of stage IV endocrine cancer.  EXAM: CT ABDOMEN AND PELVIS WITH CONTRAST  TECHNIQUE: Multidetector CT imaging of the abdomen and pelvis was performed using the standard protocol following bolus administration of intravenous  contrast.  CONTRAST:  90mL OMNIPAQUE IOHEXOL 300 MG/ML SOLN, 127mL OMNIPAQUE IOHEXOL 300 MG/ML SOLN  COMPARISON:  CT abdomen and pelvis 02/26/2014  FINDINGS: Bibasilar airspace opacities are noted, most likely atelectasis. Heart is normal size. No effusions.  Large abnormal area of enhancement involving the entire left hepatic lobe. While a component of this may be altered perfusion, I suspect much of this is mass. This area measures 18 x 10 cm. Throughout the right hepatic lobe are numerous small low-density lesions as well compatible with numerous metastases. 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  Abnormal soft tissue extends along the anterior surface of the liver, presumably subcapsular or peritoneal extension from the liver mass. This area measures 8.9 x 2.5 cm. Abnormal soft tissue also extends around the gallbladder, presumably direct extension from the liver disease.  Spleen, pancreas, adrenals and left kidney are unremarkable. Right kidney is absent, right kidney is severely atrophic stomach, small bowel grossly unremarkable.  Extensive abnormal polypoid soft tissue noted in the rectosigmoid colon compatible with tumor.  Prior prostatectomy. Urinary bladder is decompressed, grossly unremarkable.  Aneurysmal dilatation of the right common iliac artery is stable. Aorta is calcified, non aneurysmal.  Old right superior and inferior pubic rami fractures. No acute bony abnormality or focal bone lesion.  IMPRESSION: Extensive metastatic disease throughout the liver, with large confluent mass involving the entire left hepatic lobe measuring up  to 18 cm. This occludes the left portal vein. Extrahepatic extension of tumor anterior to the left hepatic lobe and around the gallbladder. Small amount of free fluid along the right inferior hepatic edge and in the right paracolic gutter.  Extensive polypoid mass throughout the rectosigmoid colon compatible with  tumor.  Prior prostatectomy.  Severely atrophic right kidney, stable.  Bibasilar atelectasis.   Electronically Signed   By: Rolm Baptise M.D.   On: 12/08/2014 20:53      Assessment & Plan  *Acute GI bleed-*CT demonstrates gross tumor in the rectosigmoid in addition to extensive metastatic disease in the left lobe of the liver.*GI bleeding may be emanating from this area.  In addition, he has lesions in the antrum and duodenum which may be metastatic.  Multiple clean-based ulcers were seen.  These are another potential source for bleeding.  In either case overt bleeding has stopped.  Recommendations #1 advance diet #2 need to speak with oncologist at [facility] to coordinate care  Principal Problem:   Acute blood loss anemia Active Problems:   Hx of radiation therapy   Bilateral pulmonary embolism   GI bleed   Cancer of liver   Thrombocytopenia   Malignant neoplasm of prostate   Antineoplastic chemotherapy induced pancytopenia   Hypokalemia   Neutropenic fever   Anemia of chronic disease   Protein-calorie malnutrition, severe   Duodenal ulcer hemorrhage   Gastric ulcer with hemorrhage     LOS: 1 day   Nicholas Ayala  12/09/2014, 10:47 AM

## 2014-12-09 NOTE — Progress Notes (Addendum)
Patient ID: Nicholas Ayala, male   DOB: [birth year], 53 y.o.   MRN: [id number] [facility] PROGRESS NOTE  Nicholas Ayala [id number]:[id number] DOB: 09/19/1961 DOA: 12/08/2014 PCP: PROVIDER NOT IN SYSTEM  Brief narrative:    53 y.o. male with history of prostate cancer, status post prostatectomy , radiation therapy, recent diagnosis of liver and colon cancer (follows at [place]) ?neuroendocrine tumor, has had recent chemotherapy about one and a half week ago, history of PE (was on coumadin and Lovenox but that was stopped). Patient presented to [facility] ED with reports of melenotic and bloody stools started few days prior to this admission. Patient also reported abdominal pain but no vomiting. In ED, BP was 119/58, HR 65, RR 15, T max 99.8 F and oxygen saturation 99% on room air. Blood work showed WBC count 0.8, hemoglobin 4.9, platelet count 43, potassium less than 2 and INR 1.09. Gastroenterology has seen the patient in consultation with recommendations to continue PPI gtt and octreotide gtt, FFP and blood transfusion and EGD 12/08/2014. Additionally, he was started on abx for treatment of febrile neutropenia.    Assessment/Plan:    Principal Problem: Acute blood loss anemia /  GI bleed /  Anemia of chronic disease  CT demonstrated gross tumor in the rectosigmoid in addition to extensive metastatic disease in the left lobe of the liver. GI bleeding may be emanating from this area. In addition, based on EGD 12/08/2014 patient has lesions in the antrum and duodenum which may be metastatic. Multiple clean-based ulcers were seen. These are another potential source for bleeding.   Patient was transfused PRBC x 1 since admission, one FFP.  Patient was on octreotide and protonix drip since admission. Discontinued 12/09/2014. Will continue protonix 40 mg IV Q 12 hours.   Hemoglobin is 7.1 this am. Follow up CBC in am. Transfuse if Hemoglobin less than 7.  Active Problems: Abdominal pain  Likely due to  history of liver and colon cancer.   Neutropenic fever  On admission low grade fever in immunocompromised pt with recent chemotherapy in addition to neutropenia.   Patient started on rocephin but we switched to cefepime for neutropenic fever.   Started neupogen 480 mcg daily 12/08/2014.  Continue to monitor CBC daily.   Antineoplastic chemotherapy induced pancytopenia /Thrombocytopenia  Recent chemotherapy, one and a half week prior to this admission.  Monitor CBC daily.  Hypokalemia / hypomagnesemia  Due to GI losses, hypomagnesemia.  Potassium being supplemented through IV fluids and additional 40 meq PO given today.  Magnesium 1.2 on admission. Supplemented with magnesium sulfate on 12/26 2 gm IV. This am Magnesium level is 1.3 so supplemented today again.    Bilateral pulmonary embolism  Not on AC. Pt at high risk of bleed.   Colon cancer, liver cancer / elevated ALP  Follows in [place]. Elevated ALP due to liver cancer.    Malignant neoplasm of prostate  Status post prostatectomy and RT   Protein-calorie malnutrition, severe  In the context of chronic illness  Nutrition consulted     DVT Prophylaxis   SCD's bilaterally due to risk of bleeding  Code Status: Full.  Family Communication:  plan of care discussed with the patient and his wife at the bedside  Disposition Plan: remains in SDU  IV access:  Peripheral IV  Procedures and diagnostic studies:    EGD 12/08/2014 -  multiple mucosal nodules in the apex of the duodenal bulb and in the gastric antrum with ulcerations. Ulcers were clean-based. 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  Medical Consultants:  Dr. Erskine Emery, Gastroenterology  Other Consultants:  Nutrition    IAnti-Infectives:   Rocephin given on admission Cefepime 12/08/2014 -->   Nicholas Lenz, MD  [facility] Pager [phone]  If 7PM-7AM, please contact  night-coverage [email or web address] Password [facility] 12/09/2014, 12:47 PM   LOS: 1 day    HPI/Subjective: No acute overnight events.  Objective: Filed Vitals:   12/09/14 0830 12/09/14 0900 12/09/14 1000 12/09/14 1100  BP: 104/65  102/66   Pulse: 77 71 64 64  Temp:      TempSrc:      Resp: 18 4 8 9   Height:      Weight:      SpO2: 98% 95% 97% 97%    Intake/Output Summary (Last 24 hours) at 12/09/14 1247 Last data filed at 12/09/14 1152  Gross per 24 hour  Intake   2995 ml  Output   1400 ml  Net   1595 ml    Exam:   General:  Pt is alert, follows commands appropriately, not in acute distress  Cardiovascular: Regular rate and rhythm, S1/S2 appreciated   Respiratory: Clear to auscultation bilaterally, no wheezing, no crackles, no rhonchi  Abdomen: mid abdomen tenderness, bowel sounds present  Extremities: trace pitting LE edema, pulses DP and PT palpable bilaterally  Neuro: Grossly nonfocal  Data Reviewed: Basic Metabolic Panel:  Recent Labs Lab 12/08/14 0446 12/09/14 0406  NA 147* 144  K <2.0* 2.2*  CL 111 115*  CO2 24 20  GLUCOSE 110* 132*  BUN 18 14  CREATININE 0.86 0.91  CALCIUM 7.7* 6.6*  MG 1.2* 1.3*   Liver Function Tests:  Recent Labs Lab 12/08/14 0446 12/09/14 0406  AST 22 18  ALT 16 15  ALKPHOS 291* 225*  BILITOT 1.0 0.9  PROT 7.2 6.1  ALBUMIN 2.8* 2.6*    Recent Labs Lab 12/08/14 0446  LIPASE 10*   No results for input(s): AMMONIA in the last 168 hours. CBC:  Recent Labs Lab 12/08/14 0446 12/08/14 1451 12/09/14 0406  WBC 0.6* 0.5* 0.8*  NEUTROABS 0.0*  --   --   HGB 4.9* 7.6* 7.1*  HCT 15.4* 22.7* 22.3*  MCV 84.6 85.0 86.8  PLT 43* 68* 100*   Cardiac Enzymes: No results for input(s): CKTOTAL, CKMB, CKMBINDEX, TROPONINI in the last 168 hours. BNP: Invalid input(s): POCBNP CBG: No results for input(s): GLUCAP in the last 168 hours.  Recent Results (from the past 240 hour(s))  MRSA PCR Screening     Status: None    Collection Time: 12/08/14  6:39 AM  Result Value Ref Range Status   MRSA by PCR NEGATIVE NEGATIVE Final     Scheduled Meds: . filgrastim (NEUPOGEN)   480 mcg Intravenous q1800  . pantoprazole (PROTONIX) IV  40 mg Intravenous Q12H  . potassium chloride  10 mEq Intravenous Q1 Hr x 5  . sertraline  100 mg Oral Daily  . traZODone  50 mg Oral QHS  . cyanocobalamin  100 mcg Oral Daily   Continuous Infusions: . sodium chloride 0.9 % 1,000 mL with potassium chloride 40 mEq infusion 50 mL/hr at 12/09/14 0600

## 2014-12-10 ENCOUNTER — Encounter (HOSPITAL_COMMUNITY): Payer: Self-pay | Admitting: Gastroenterology

## 2014-12-10 DIAGNOSIS — Z923 Personal history of irradiation: Secondary | ICD-10-CM

## 2014-12-10 LAB — CBC
HEMATOCRIT: 23.4 % — AB (ref 39.0–52.0)
Hemoglobin: 7.8 g/dL — ABNORMAL LOW (ref 13.0–17.0)
MCH: 27.9 pg (ref 26.0–34.0)
MCHC: 33.3 g/dL (ref 30.0–36.0)
MCV: 83.6 fL (ref 78.0–100.0)
Platelets: 144 10*3/uL — ABNORMAL LOW (ref 150–400)
RBC: 2.8 MIL/uL — AB (ref 4.22–5.81)
RDW: 15.5 % (ref 11.5–15.5)
WBC: 3.3 10*3/uL — ABNORMAL LOW (ref 4.0–10.5)

## 2014-12-10 LAB — BASIC METABOLIC PANEL
Anion gap: 5 (ref 5–15)
BUN: 10 mg/dL (ref 6–23)
CALCIUM: 7.1 mg/dL — AB (ref 8.4–10.5)
CO2: 24 mmol/L (ref 19–32)
CREATININE: 0.74 mg/dL (ref 0.50–1.35)
Chloride: 116 mEq/L — ABNORMAL HIGH (ref 96–112)
GFR calc Af Amer: >90 mL/min (ref >90)
GFR calc non Af Amer: >90 mL/min (ref >90)
Glucose, Bld: 106 mg/dL — ABNORMAL HIGH (ref 70–99)
Potassium: 2.3 mmol/L — CL (ref 3.5–5.1)
Sodium: 145 mmol/L (ref 135–145)

## 2014-12-10 LAB — MAGNESIUM: Magnesium: 1.7 mg/dL (ref 1.5–2.5)

## 2014-12-10 MED ORDER — ATORVASTATIN CALCIUM 80 MG PO TABS
80.0000 mg | ORAL_TABLET | Freq: Every day | ORAL | Status: DC
  Administered 2014-12-10 – 2014-12-11 (×2): 80 mg via ORAL
  Filled 2014-12-10 (×2): qty 1

## 2014-12-10 MED ORDER — ENSURE COMPLETE PO LIQD
237.0000 mL | Freq: Two times a day (BID) | ORAL | Status: DC
  Administered 2014-12-10 – 2014-12-11 (×2): 237 mL via ORAL

## 2014-12-10 MED ORDER — POTASSIUM CHLORIDE 10 MEQ/100ML IV SOLN
10.0000 meq | INTRAVENOUS | Status: AC
  Administered 2014-12-10 (×5): 10 meq via INTRAVENOUS
  Filled 2014-12-10 (×5): qty 100

## 2014-12-10 MED ORDER — ALLOPURINOL 300 MG PO TABS
300.0000 mg | ORAL_TABLET | Freq: Every day | ORAL | Status: DC
Start: 2014-12-10 — End: 2014-12-11
  Administered 2014-12-10 – 2014-12-11 (×2): 300 mg via ORAL
  Filled 2014-12-10 (×2): qty 1
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
## 2014-12-10 NOTE — Progress Notes (Addendum)
Patient ID: Nicholas Ayala, male   DOB: 1961/05/23, 52 y.o.   MRN: [id number] [facility] PROGRESS NOTE  Nicholas Ayala BZJ:[id number] DOB: 1961-06-13 DOA: 12/08/2014 PCP: PROVIDER NOT IN SYSTEM  Brief narrative:    53 y.o. male with history of prostate cancer, status post prostatectomy , radiation therapy, recent diagnosis of liver and colon cancer (follows at [facility]) per patient he has neuroendocrine tumor, has had recent chemotherapy about one and a half week PTA, history of PE (was on coumadin and Lovenox but that was stopped). Patient presented to [facility] ED with reports of melenotic and bloody stools started few days prior to this admission. Patient also reported abdominal pain but no vomiting. In ED, BP was 119/58, HR 65, RR 15, T max 99.8 F and oxygen saturation 99% on room air. Blood work showed WBC count 0.8, hemoglobin 4.9, platelet count 43, potassium less than 2 and INR 1.09. Gastroenterology has seen the patient in consultation with recommendations on admission  to continue PPI gtt and octreotide gtt, FFP and blood transfusion. Patient underwent EGD 12/08/2014 with findings of lesions in the antrum and duodenum which may be metastatic. Multiple clean-based ulcers were seen. These are another potential source for bleeding. Additionally, he was started on abx for treatment of febrile neutropenia.    Assessment/Plan:     Principal Problem: Acute blood loss anemia /  GI bleed /  Anemia of chronic disease  CT demonstrated gross tumor in the rectosigmoid in addition to extensive metastatic disease in the left lobe of the liver. GI bleeding may be emanating from this area. In addition, based on EGD 12/08/2014 patient has lesions in the antrum and duodenum which may be metastatic. Multiple clean-based ulcers were seen. These are another potential source for bleeding.   Patient was transfused PRBC x 1 since admission, one FFP.  Patient was on octreotide and protonix drip since  admission. Discontinued 12/09/2014. Will continue protonix 40 mg IV Q 12 hours.   Hemoglobin is 7.8 this am. Continue to monitor CBC daily.   Active Problems: Abdominal pain  Likely due to history of liver and colon cancer.   Patient reported his abdominal pain controlled with current analgesia.   Neutropenic fever  On admission low grade fever in immunocompromised pt with recent chemotherapy in addition to neutropenia.   Patient started on rocephin but we switched to cefepime for neutropenic fever.   Started neupogen 480 mcg daily 12/08/2014. This am WBC count is 3.3 so we will stop Neupogen.  Continue to monitor CBC daily.   Antineoplastic chemotherapy induced pancytopenia /Thrombocytopenia  Recent chemotherapy, one and a half week prior to this admission.  Monitor CBC daily. WBC count and platelet count improving.   Hypokalemia / hypomagnesemia  Due to GI losses, hypomagnesemia.  Potassium being supplemented through IV fluids  Magnesium 1.2 on admission. Supplemented with magnesium sulfate on 12/26 2 gm IV. Magnesium level was 1.3 12/27 and was again supplemented. This am it is WNL.    Bilateral pulmonary embolism  Not on AC. Pt at high risk of bleed.   Colon cancer, liver cancer / elevated ALP  Follows in [place]. Elevated ALP due to liver cancer.    Malignant neoplasm of prostate  Status post prostatectomy and RT   Protein-calorie malnutrition, severe  In the context of chronic illness  Nutrition consulted     DVT Prophylaxis   SCD's bilaterally due to risk of bleeding  Code Status: Full.  Family Communication:  plan of care  discussed with the patient and his wife at the bedside  Disposition Plan: remains in SDU  IV access:  Peripheral IV  Procedures and diagnostic studies:    EGD 12/08/2014 -  multiple mucosal nodules in the apex of the duodenal bulb and in the gastric antrum with ulcerations. Ulcers were clean-based. 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  Medical Consultants:  Dr. Erskine Emery, Gastroenterology  Other Consultants:  Nutrition    IAnti-Infectives:   Rocephin given on admission Cefepime 12/08/2014 -->    [name], MD  [facility] Pager [phone]  If 7PM-7AM, please contact night-coverage [email or web address] Password [facility] 12/10/2014, 6:24 AM   LOS: 2 days    HPI/Subjective: No acute overnight events. Feels better this am.  Objective: Filed Vitals:   12/10/14 0100 12/10/14 0200 12/10/14 0300 12/10/14 0400  BP:  142/87    Pulse: 64 63 71   Temp:    98.4 F (36.9 C)  TempSrc:    Oral  Resp: 15 17 11    Height:      Weight:      SpO2: 97% 97% 97%     Intake/Output Summary (Last 24 hours) at 12/10/14 0109 Last data filed at 12/10/14 0400  Gross per 24 hour  Intake 2189.17 ml  Output    950 ml  Net 1239.17 ml    Exam:   General:  Pt is alert, follows commands appropriately, not in acute distress  Cardiovascular: Regular rate and rhythm, S1/S2 appreciated   Respiratory: bilateral air entry, no wheezing  Abdomen: Soft, non tender, non distended, bowel sounds present  Extremities: No edema, pulses DP and PT palpable bilaterally  Neuro: Grossly nonfocal  Data Reviewed: Basic Metabolic Panel:  Recent Labs Lab 12/08/14 0446 12/09/14 0406 12/10/14 0340  NA 147* 144 145  K <2.0* 2.2* 2.3*  CL 111 115* 116*  CO2 24 20 24   GLUCOSE 110* 132* 106*  BUN 18 14 10   CREATININE 0.86 0.91 0.74  CALCIUM 7.7* 6.6* 7.1*  MG 1.2* 1.3* 1.7   Liver Function Tests:  Recent Labs Lab 12/08/14 0446 12/09/14 0406  AST 22 18  ALT 16 15  ALKPHOS 291* 225*  BILITOT 1.0 0.9  PROT 7.2 6.1  ALBUMIN 2.8* 2.6*    Recent Labs Lab 12/08/14 0446  LIPASE 10*   No results for input(s): AMMONIA in the last 168 hours. CBC:  Recent Labs Lab 12/08/14 0446 12/08/14 1451 12/09/14 0406 12/10/14 0340  WBC 0.6* 0.5* 0.8* 3.3*  NEUTROABS 0.0*  --    --   --   HGB 4.9* 7.6* 7.1* 7.8*  HCT 15.4* 22.7* 22.3* 23.4*  MCV 84.6 85.0 86.8 83.6  PLT 43* 68* 100* 144*   Cardiac Enzymes: No results for input(s): CKTOTAL, CKMB, CKMBINDEX, TROPONINI in the last 168 hours. BNP: Invalid input(s): POCBNP CBG: No results for input(s): GLUCAP in the last 168 hours.  Recent Results (from the past 240 hour(s))  MRSA PCR Screening     Status: None   Collection Time: 12/08/14  6:39 AM  Result Value Ref Range Status   MRSA by PCR NEGATIVE NEGATIVE Final  Clostridium Difficile by PCR     Status: None   Collection Time: 12/09/14 12:38 PM  Result Value Ref Range Status   C difficile by pcr NEGATIVE NEGATIVE Final    Comment: Performed at [facility]     Scheduled Meds: . filgrastim (NEUPOGEN) IV  480 mcg Intravenous q1800  .  pantoprazole (PROTONIX) IV  40 mg Intravenous Q12H  . potassium chloride  10 mEq Intravenous Q1 Hr x 5  . sertraline  100 mg Oral Daily  . traZODone  50 mg Oral QHS  . cyanocobalamin  100 mcg Oral Daily   Continuous Infusions: . sodium chloride 0.9 % 1,000 mL with potassium chloride 40 mEq infusion 50 mL/hr at 12/09/14 1814

## 2014-12-10 NOTE — Progress Notes (Signed)
RN faxed Medical Records request form to [facility] medical center at [phone].

## 2014-12-10 NOTE — Progress Notes (Signed)
Progress Note   Subjective  no further bleeding. Stools normal color now per RN   Objective   Vital signs in last 24 hours: Temp:  [98.1 F (36.7 C)-99.7 F (37.6 C)] 98.1 F (36.7 C) (12/28 0800) Pulse Rate:  [57-81] 78 (12/28 1000) Resp:  [5-21] 20 (12/28 1000) BP: (79-142)/(53-87) 103/57 mmHg (12/28 1000) SpO2:  [93 %-100 %] 99 % (12/28 1000) Last BM Date: 12/09/14 General:    Pleasant black male in NAD Heart:  Regular rate and rhythm Abdomen:  Soft, nontender and nondistended. Normal bowel sounds. Extremities:  Without edema. Neurologic:  Alert and oriented,  grossly normal neurologically. Psych:  Cooperative. Normal mood and affect.  Lab Results:  Recent Labs  12/08/14 1451 12/09/14 0406 12/10/14 0340  WBC 0.5* 0.8* 3.3*  HGB 7.6* 7.1* 7.8*  HCT 22.7* 22.3* 23.4*  PLT 68* 100* 144*   BMET  Recent Labs  12/08/14 0446 12/09/14 0406 12/10/14 0340  NA 147* 144 145  K <2.0* 2.2* 2.3*  CL 111 115* 116*  CO2 24 20 24   GLUCOSE 110* 132* 106*  BUN 18 14 10   CREATININE 0.86 0.91 0.74  CALCIUM 7.7* 6.6* 7.1*   LFT  Recent Labs  12/09/14 0406  PROT 6.1  ALBUMIN 2.6*  AST 18  ALT 15  ALKPHOS 225*  BILITOT 0.9   PT/INR  Recent Labs  12/08/14 0446  LABPROT 14.2  INR 1.09    Studies/Results: Ct Abdomen Pelvis W Contrast  12/08/2014   CLINICAL DATA:  Progressively worsening melena. History of stage IV endocrine cancer.  EXAM: CT ABDOMEN AND PELVIS WITH CONTRAST  TECHNIQUE: Multidetector CT imaging of the abdomen and pelvis was performed using the standard protocol following bolus administration of intravenous contrast.  CONTRAST:  60mL OMNIPAQUE IOHEXOL 300 MG/ML SOLN, 117mL OMNIPAQUE IOHEXOL 300 MG/ML SOLN  COMPARISON:  CT abdomen and pelvis 02/26/2014  FINDINGS: Bibasilar airspace opacities are noted, most likely atelectasis. Heart is normal size. No effusions.  Large abnormal area of enhancement involving the entire left hepatic lobe. While a  component of this may be altered perfusion, I suspect much of this is mass. This area measures 18 x 10 cm. Throughout the right hepatic lobe are numerous small low-density lesions as well compatible with numerous metastases. 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  Abnormal soft tissue extends along the anterior surface of the liver, presumably subcapsular or peritoneal extension from the liver mass. This area measures 8.9 x 2.5 cm. Abnormal soft tissue also extends around the gallbladder, presumably direct extension from the liver disease.  Spleen, pancreas, adrenals and left kidney are unremarkable. Right kidney is absent, right kidney is severely atrophic stomach, small bowel grossly unremarkable.  Extensive abnormal polypoid soft tissue noted in the rectosigmoid colon compatible with tumor.  Prior prostatectomy. Urinary bladder is decompressed, grossly unremarkable.  Aneurysmal dilatation of the right common iliac artery is stable. Aorta is calcified, non aneurysmal.  Old right superior and inferior pubic rami fractures. No acute bony abnormality or focal bone lesion.  IMPRESSION: Extensive metastatic disease throughout the liver, with large confluent mass involving the entire left hepatic lobe measuring up to 18 cm. This occludes the left portal vein. Extrahepatic extension of tumor anterior to the left hepatic lobe and around the gallbladder. Small amount of free fluid along the right inferior hepatic edge and in the right paracolic gutter.  Extensive polypoid mass throughout the rectosigmoid colon  compatible with tumor.  Prior prostatectomy.  Severely atrophic right kidney, stable.  Bibasilar atelectasis.   Electronically Signed   By: Rolm Baptise M.D.   On: 12/08/2014 20:53      Assessment / Plan:   32. 53 year old male with GI bleed, upper vrs lower. EGD revealed multiple ulcerated (clean based) nodules in antrum and duodenal bulb. Lesions  possibly metastatic disease but path pending. Also, CTscan reveals large rectosigmoid mass / extensive liver mets. Bleeding could have been from ulcers or either rectosigmoid mass. Patient is followed at [facility], he is s/p one treatment of chemo so far for ?neuroendocrine tumor.  Bleeding has resolved.  Hgb low but stable at 7.8.  2. Hypokalemia, primary team already addressing  3. Pancytopenia, likely related to chemo  4. Hx of bilateral PE on March 2015 CTA chest    LOS: 2 days   Nicholas Ayala  12/10/2014, 11:07 AM  [facility] Attending  I have also seen and assessed the patient and agree with the above note. Path pending. Have not seen [facility] records yet. Clinically better - if stable over next 24-48 hrs probably ok to dc and get back to [place] for further care though seems like odds are long for any good long-term outcome. It also seems unlikely that sigmoidoscopy would change what we do at this time.   Gatha Mayer, MD, [facility] [phone] (pager) 12/10/2014 3:30 PM

## 2014-12-10 NOTE — Progress Notes (Signed)
CRITICAL VALUE ALERT  Critical value received:  Potassium 2.3    Date of notification:  12/10/2014   Time of notification:  0425  Critical value read back:Yes.    Nurse who received alert:  Irene Pap   MD notified (1st page):  Fredirick Maudlin, NP  Time of first page:  [phone]  MD notified (2nd page):  Time of second page:  Responding MD:  Fredirick Maudlin, NP  Time MD responded:  [phone]

## 2014-12-11 LAB — BASIC METABOLIC PANEL
Anion gap: 5 (ref 5–15)
BUN: 7 mg/dL (ref 6–23)
CALCIUM: 6.8 mg/dL — AB (ref 8.4–10.5)
CO2: 23 mmol/L (ref 19–32)
Chloride: 117 mEq/L — ABNORMAL HIGH (ref 96–112)
Creatinine, Ser: 0.61 mg/dL (ref 0.50–1.35)
GFR calc Af Amer: >90 mL/min (ref >90)
Glucose, Bld: 90 mg/dL (ref 70–99)
Potassium: 2.3 mmol/L — CL (ref 3.5–5.1)
Sodium: 145 mmol/L (ref 135–145)

## 2014-12-11 LAB — CBC
HEMATOCRIT: 21.7 % — AB (ref 39.0–52.0)
HEMOGLOBIN: 7.2 g/dL — AB (ref 13.0–17.0)
MCH: 28.1 pg (ref 26.0–34.0)
MCHC: 33.2 g/dL (ref 30.0–36.0)
MCV: 84.8 fL (ref 78.0–100.0)
Platelets: 169 10*3/uL (ref 150–400)
RBC: 2.56 MIL/uL — AB (ref 4.22–5.81)
RDW: 15.8 % — ABNORMAL HIGH (ref 11.5–15.5)
WBC: 11.8 10*3/uL — ABNORMAL HIGH (ref 4.0–10.5)

## 2014-12-11 MED ORDER — ENSURE COMPLETE PO LIQD: 237.0000 mL | Freq: Two times a day (BID) | ORAL | Status: AC

## 2014-12-11 MED ORDER — PANTOPRAZOLE SODIUM 40 MG PO TBEC: 40.0000 mg | DELAYED_RELEASE_TABLET | Freq: Two times a day (BID) | ORAL | Status: AC

## 2014-12-11 MED ORDER — POTASSIUM CHLORIDE 10 MEQ/100ML IV SOLN
10.0000 meq | INTRAVENOUS | Status: AC
  Administered 2014-12-11 (×4): 10 meq via INTRAVENOUS
  Filled 2014-12-11 (×4): qty 100

## 2014-12-11 MED ORDER — POTASSIUM CHLORIDE ER 20 MEQ PO TBCR: 20.0000 meq | EXTENDED_RELEASE_TABLET | Freq: Two times a day (BID) | ORAL | Status: AC

## 2014-12-11 NOTE — Progress Notes (Signed)
CRITICAL VALUE ALERT  Critical value received:  K- 2.3   Date of notification:  12/11/14  Time of notification:  0812  Critical value read back:Yes.    Nurse who received alert:  Graciela Husbands  MD notified (1st page):  Charlies Silvers  Time of first page:  0813  MD notified (2nd page):  Time of second page:  Responding MD:  Dr. Doyle Askew  Time MD responded:  0953-IV K ordered

## 2014-12-11 NOTE — Discharge Summary (Signed)
Physician Discharge Summary  Nicholas Ayala YPP:[id number] DOB: 1961-10-15 DOA: 12/08/2014  PCP: PROVIDER NOT IN SYSTEM  Admit date: 12/08/2014 Discharge date: 12/11/2014  Recommendations for Outpatient Follow-up:  Follow up immediately at [facility] center after discharge. You hemoglobin is low but stable at 7.2. No coumadin or any blood thinning products including aspirin on discharge because of risk of bleeding.  Take potassium supplementation as prescribed for next 10 days. Please have [facility] or [facility] recheck potassium level in next couple of days to make sure it is repleted.  Discharge Diagnoses:  Principal Problem:   Acute blood loss anemia Active Problems:   GI bleed   Antineoplastic chemotherapy induced pancytopenia   Hypokalemia   Neutropenic fever   Hx of radiation therapy   Bilateral pulmonary embolism   Cancer of liver   Thrombocytopenia   Malignant neoplasm of prostate   Anemia of chronic disease   Protein-calorie malnutrition, severe   Duodenal ulcer hemorrhage   Gastric ulcer with hemorrhage   AP (abdominal pain)    Discharge Condition: stable   Diet recommendation: as tolerated   History of present illness:  53 y.o. male with history of prostate cancer, status post prostatectomy , radiation therapy, recent diagnosis of liver and colon cancer (follows at [place]) per patient he has neuroendocrine tumor, has had recent chemotherapy about one and a half week PTA, history of PE (was on coumadin and Lovenox but that was stopped). Patient presented to [facility] ED with reports of melenotic and bloody stools started few days prior to this admission. Patient also reported abdominal pain but no vomiting. In ED, BP was 119/58, HR 65, RR 15, T max 99.8 F and oxygen saturation 99% on room air. Blood work showed WBC count 0.8, hemoglobin 4.9, platelet count 43, potassium less than 2 and INR 1.09. Gastroenterology has seen the patient in consultation with recommendations on admission to continue  PPI gtt and octreotide gtt, FFP and blood transfusion. Patient underwent EGD 12/08/2014 with findings of lesions in the antrum and duodenum which may be metastatic. Multiple clean-based ulcers were seen. These are another potential source for bleeding. Additionally, he was started on abx for treatment of febrile neutropenia.    Assessment/Plan:     Principal Problem: Acute blood loss anemia / GI bleed / Anemia of chronic disease  CT demonstrated gross tumor in the rectosigmoid in addition to extensive metastatic disease in the left lobe of the liver. GI bleeding may be emanating from this area. In addition, based on EGD 12/08/2014 patient has lesions in the antrum and duodenum which may be metastatic. Multiple clean-based ulcers were seen. These are another potential source for bleeding.   Patient was transfused PRBC x 1 since admission, one FFP.  Patient was on octreotide and protonix drip since admission. Discontinued 12/09/2014. Will continue protonix 40 mg PO Q 12 hours.   Hemoglobin is 7.2 this am. Pt advised not take coumadin, aspirin, Lovenox due to risk of bleeding.   Active Problems: Abdominal pain  Likely due to history of liver and colon cancer.   Patient reported his abdominal pain controlled.  Neutropenic fever  On admission low grade fever in immunocompromised pt with recent chemotherapy in addition to neutropenia.   Patient started on rocephin but we switched to cefepime for neutropenic fever.   Started neupogen 480 mcg daily 12/08/2014. Neupogen stopped 12/10/2014. WBC count 11.8. Since WBC count improved, no fevers may stop abx on discharge.    Antineoplastic chemotherapy induced pancytopenia /Thrombocytopenia  Recent  chemotherapy, one and a half week prior to this admission.  CBC stable.  Hypokalemia / hypomagnesemia  Due to GI losses, hypomagnesemia.  Potassium was supplemented through IV fluids and PO / IV. Prescription for potassium  given on discharge.   Magnesium 1.2 on admission. Supplemented with magnesium sulfate on 12/26 2 gm IV. Magnesium level was 1.3 12/27 and was again supplemented. Repeat magnesium WNL.   Bilateral pulmonary embolism  Not on AC. Pt at high risk of bleed.  Colon cancer, liver cancer / elevated ALP  Follows in [place]. Elevated ALP due to liver cancer.   Malignant neoplasm of prostate  Status post prostatectomy and RT  Protein-calorie malnutrition, severe  In the context of chronic illness  Nutrition consulted    DVT Prophylaxis   SCD's bilaterally due to risk of bleeding  Code Status: Full.  Family Communication: plan of care discussed with the patient and his wife at the bedside    IV access:  Peripheral IV  Procedures and diagnostic studies:   EGD 12/08/2014 - multiple mucosal nodules in the apex of the duodenal bulb and in the gastric antrum with ulcerations. Ulcers were clean-based. 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  Medical Consultants:  Dr. Erskine Emery, Gastroenterology  Other Consultants:  Nutrition   IAnti-Infectives:   Rocephin given on admission Cefepime 12/08/2014 --> 12/11/2014    Signed:  [name], MD  [facility] 12/11/2014, 11:37 AM  Pager #: [phone]    Discharge Exam: Filed Vitals:   12/11/14 0514  BP: 113/62  Pulse: 74  Temp: 99 F (37.2 C)  Resp: 20   Filed Vitals:   12/10/14 1211 12/10/14 1409 12/10/14 2057 12/11/14 0514  BP: 100/54 111/66 101/57 113/62  Pulse: 70 77 73 74  Temp:  98.3 F (36.8 C) 98.8 F (37.1 C) 99 F (37.2 C)  TempSrc:  Oral Oral Oral  Resp: 23 18 20 20   Height:      Weight:      SpO2: 99% 100% 97% 97%    General: Pt is alert, follows commands appropriately, not in acute distress Cardiovascular: Regular rate and rhythm, S1/S2 +, no murmurs Respiratory: Clear to auscultation bilaterally, no wheezing, no crackles,  no rhonchi Abdominal: Soft, non tender, non distended, bowel sounds +, no guarding Extremities: no edema, no cyanosis, pulses palpable bilaterally DP and PT Neuro: Grossly nonfocal  Discharge Instructions  Discharge Instructions    Call MD for:  difficulty breathing, headache or visual disturbances    Complete by:  As directed      Call MD for:  persistant nausea and vomiting    Complete by:  As directed      Call MD for:  redness, tenderness, or signs of infection (pain, swelling, redness, odor or green/yellow discharge around incision site)    Complete by:  As directed      Call MD for:  severe uncontrolled pain    Complete by:  As directed      Diet - low sodium heart healthy    Complete by:  As directed      Discharge instructions    Complete by:  As directed   Follow up immediately at [facility] center after discharge. You hemoglobin is low but stable at 7.2. No coumadin or any blood thinning products including aspirin on discharge because of risk of bleeding.  Take potassium supplementation as prescribed for next 10 days. Please have [facility] or PCP recheck potassium level in next  couple of days to make sure it is repleted.     Increase activity slowly    Complete by:  As directed             Medication List    STOP taking these medications        artificial tears Oint ophthalmic ointment     aspirin EC 81 MG tablet     bicalutamide 50 MG tablet  Commonly known as:  CASODEX     citalopram 10 MG tablet  Commonly known as:  CELEXA     dorzolamide-timolol 22.3-6.8 MG/ML ophthalmic solution  Commonly known as:  COSOPT     enoxaparin 80 MG/0.8ML injection  Commonly known as:  LOVENOX     ketorolac 0.5 % ophthalmic solution  Commonly known as:  ACULAR     leuprolide (6 Month) 45 MG injection  Commonly known as:  ELIGARD     methylPREDNIsolone 4 MG tablet  Commonly known as:  MEDROL DOSPACK     prednisoLONE acetate 1 % ophthalmic suspension  Commonly known as:  PRED FORTE      traMADol 50 MG tablet  Commonly known as:  ULTRAM     warfarin 5 MG tablet  Commonly known as:  COUMADIN      TAKE these medications        allopurinol 300 MG tablet  Commonly known as:  ZYLOPRIM  Take 300 mg by mouth daily.     atorvastatin 80 MG tablet  Commonly known as:  LIPITOR  Take 80 mg by mouth daily.     calcium-vitamin D 250-125 MG-UNIT per tablet  Commonly known as:  OSCAL  Take 1 tablet by mouth 2 (two) times daily.     cyanocobalamin 1000 MCG tablet  Take 100 mcg by mouth daily.     feeding supplement (ENSURE COMPLETE) Liqd  Take 237 mLs by mouth 2 (two) times daily between meals.     leuprolide 30 MG injection  Commonly known as:  LUPRON  Inject 30 mg into the muscle every 6 (six) months.     loperamide 2 MG capsule  Commonly known as:  IMODIUM  Take 2 mg by mouth every 6 (six) hours as needed for diarrhea or loose stools.     oxyCODONE 5 MG immediate release tablet  Commonly known as:  Oxy IR/ROXICODONE  Take 5-10 mg by mouth every 6 (six) hours as needed (shoulder pain).     pantoprazole 40 MG tablet  Commonly known as:  PROTONIX  Take 1 tablet (40 mg total) by mouth 2 (two) times daily.     Potassium Chloride ER 20 MEQ Tbcr  Take 20 mEq by mouth 2 (two) times daily.     sertraline 100 MG tablet  Commonly known as:  ZOLOFT  Take 100 mg by mouth daily.     sildenafil 100 MG tablet  Commonly known as:  VIAGRA  Take 100 mg by mouth daily as needed for erectile dysfunction.     traZODone 50 MG tablet  Commonly known as:  DESYREL  Take 50 mg by mouth at bedtime.            Follow-up Information    Follow up with PROVIDER NOT IN SYSTEM.       The results of significant diagnostics from this hospitalization (including imaging, microbiology, ancillary and laboratory) are listed below for reference.    Significant Diagnostic Studies: Ct Abdomen Pelvis W Contrast  12/08/2014   CLINICAL DATA:  Progressively  worsening melena.  History of stage IV endocrine cancer.  EXAM: CT ABDOMEN AND PELVIS WITH CONTRAST  TECHNIQUE: Multidetector CT imaging of the abdomen and pelvis was performed using the standard protocol following bolus administration of intravenous contrast.  CONTRAST:  71mL OMNIPAQUE IOHEXOL 300 MG/ML SOLN, 155mL OMNIPAQUE IOHEXOL 300 MG/ML SOLN  COMPARISON:  CT abdomen and pelvis 02/26/2014  FINDINGS: Bibasilar airspace opacities are noted, most likely atelectasis. Heart is normal size. No effusions.  Large abnormal area of enhancement involving the entire left hepatic lobe. While a component of this may be altered perfusion, I suspect much of this is mass. This area measures 18 x 10 cm. Throughout the right hepatic lobe are numerous small low-density lesions as well compatible with numerous metastases. 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  Abnormal soft tissue extends along the anterior surface of the liver, presumably subcapsular or peritoneal extension from the liver mass. This area measures 8.9 x 2.5 cm. Abnormal soft tissue also extends around the gallbladder, presumably direct extension from the liver disease.  Spleen, pancreas, adrenals and left kidney are unremarkable. Right kidney is absent, right kidney is severely atrophic stomach, small bowel grossly unremarkable.  Extensive abnormal polypoid soft tissue noted in the rectosigmoid colon compatible with tumor.  Prior prostatectomy. Urinary bladder is decompressed, grossly unremarkable.  Aneurysmal dilatation of the right common iliac artery is stable. Aorta is calcified, non aneurysmal.  Old right superior and inferior pubic rami fractures. No acute bony abnormality or focal bone lesion.  IMPRESSION: Extensive metastatic disease throughout the liver, with large confluent mass involving the entire left hepatic lobe measuring up to 18 cm. This occludes the left portal vein. Extrahepatic extension of tumor  anterior to the left hepatic lobe and around the gallbladder. Small amount of free fluid along the right inferior hepatic edge and in the right paracolic gutter.  Extensive polypoid mass throughout the rectosigmoid colon compatible with tumor.  Prior prostatectomy.  Severely atrophic right kidney, stable.  Bibasilar atelectasis.   Electronically Signed   By: Rolm Baptise M.D.   On: 12/08/2014 20:53    Microbiology: Recent Results (from the past 240 hour(s))  MRSA PCR Screening     Status: None   Collection Time: 12/08/14  6:39 AM  Result Value Ref Range Status   MRSA by PCR NEGATIVE NEGATIVE Final  Clostridium Difficile by PCR     Status: None   Collection Time: 12/09/14 12:38 PM  Result Value Ref Range Status   C difficile by pcr NEGATIVE NEGATIVE Final    Comment: Performed at [facility]: Basic Metabolic Panel:  Recent Labs Lab 12/08/14 0446 12/09/14 0406 12/10/14 0340 12/11/14 0710  NA 147* 144 145 145  K <2.0* 2.2* 2.3* 2.3*  CL 111 115* 116* 117*  CO2 24 20 24 23   GLUCOSE 110* 132* 106* 90  BUN 18 14 10 7   CREATININE 0.86 0.91 0.74 0.61  CALCIUM 7.7* 6.6* 7.1* 6.8*  MG 1.2* 1.3* 1.7  --    Liver Function Tests:  Recent Labs Lab 12/08/14 0446 12/09/14 0406  AST 22 18  ALT 16 15  ALKPHOS 291* 225*  BILITOT 1.0 0.9  PROT 7.2 6.1  ALBUMIN 2.8* 2.6*    Recent Labs Lab 12/08/14 0446  LIPASE 10*   No results for input(s): AMMONIA in the last 168 hours. CBC:  Recent Labs Lab 12/08/14 0446 12/08/14 1451 12/09/14 0406  12/10/14 0340 12/11/14 0710  WBC 0.6* 0.5* 0.8* 3.3* 11.8*  NEUTROABS 0.0*  --   --   --   --   HGB 4.9* 7.6* 7.1* 7.8* 7.2*  HCT 15.4* 22.7* 22.3* 23.4* 21.7*  MCV 84.6 85.0 86.8 83.6 84.8  PLT 43* 68* 100* 144* 169   Cardiac Enzymes: No results for input(s): CKTOTAL, CKMB, CKMBINDEX, TROPONINI in the last 168 hours. BNP: BNP (last 3 results) No results for input(s): PROBNP in the last 8760 hours. CBG: No results  for input(s): GLUCAP in the last 168 hours.  Time coordinating discharge: Over 30 minutes

## 2014-12-11 NOTE — Progress Notes (Signed)
Patient discharged.  Prescriptions and discharge instructions and patient verbalized understanding.  Pt dressed and escorted via [place] to awaiting private vehicle by [employer].

## 2014-12-11 NOTE — Discharge Instructions (Signed)

## 2014-12-11 NOTE — Progress Notes (Signed)
    Progress Note   Subjective  feels great. No further bleeding. Eating breakfast   Objective   Vital signs in last 24 hours: Temp:  [98.1 F (36.7 C)-99 F (37.2 C)] 99 F (37.2 C) (12/29 0514) Pulse Rate:  [70-78] 74 (12/29 0514) Resp:  [18-23] 20 (12/29 0514) BP: (100-113)/(54-66) 113/62 mmHg (12/29 0514) SpO2:  [97 %-100 %] 97 % (12/29 0514) Last BM Date: 12/10/14 General:    Pleasant black male in NAD Abdomen:  Soft, nontender and nondistended. Normal bowel sounds. Neurologic:  Alert and oriented,  grossly normal neurologically. Psych:  Cooperative. Normal mood and affect.    Lab Results:  Recent Labs  12/09/14 0406 12/10/14 0340 12/11/14 0710  WBC 0.8* 3.3* 11.8*  HGB 7.1* 7.8* 7.2*  HCT 22.3* 23.4* 21.7*  PLT 100* 144* 169   BMET  Recent Labs  12/09/14 0406 12/10/14 0340 12/11/14 0710  NA 144 145 145  K 2.2* 2.3* 2.3*  CL 115* 116* 117*  CO2 20 24 23   GLUCOSE 132* 106* 90  BUN 14 10 7   CREATININE 0.91 0.74 0.61  CALCIUM 6.6* 7.1* 6.8*   LFT  Recent Labs  12/09/14 0406  PROT 6.1  ALBUMIN 2.6*  AST 18  ALT 15  ALKPHOS 225*  BILITOT 0.9      Assessment / Plan:    42. 53 year old male with GI bleed on coumadin, upper vrs lower. EGD revealed multiple ulcerated (clean based) nodules in antrum and duodenal bulb. Lesions possibly metastatic disease but path still pending. Also, CTscan reveals large rectosigmoid mass / extensive liver mets. Bleeding could have been from ulcers or either rectosigmoid mass. Patient is followed at [facility], he is s/p one treatment of chemo so far for ?neuroendocrine tumor (we don't have records). Bleeding has resolved. Hgb low but stable at 7.2. No further GI workup planned, would recommend he stay off coumadin if possible. [facility]  2. Persistent hypokalemia, primary team to address.    LOS: 3 days   Tye Savoy  12/11/2014, 9:47 AM   Agree with Ms. Vanita Ingles assessment and  plan. Gatha Mayer, MD, [facility]

## 2015-12-19 ENCOUNTER — Encounter (HOSPITAL_COMMUNITY): Payer: Self-pay | Admitting: Emergency Medicine

## 2015-12-19 ENCOUNTER — Inpatient Hospital Stay (HOSPITAL_COMMUNITY): Payer: Medicare Other

## 2015-12-19 ENCOUNTER — Inpatient Hospital Stay (HOSPITAL_COMMUNITY)
Admission: EM | Admit: 2015-12-19 | Discharge: 2015-12-21 | DRG: 682 | Disposition: A | Discharge status: Home / Self Care | Payer: Medicare Other | Attending: Internal Medicine | Admitting: Internal Medicine

## 2015-12-19 ENCOUNTER — Emergency Department (HOSPITAL_COMMUNITY): Payer: Medicare Other

## 2015-12-19 DIAGNOSIS — C7800 Secondary malignant neoplasm of unspecified lung: Secondary | ICD-10-CM | POA: Diagnosis present

## 2015-12-19 DIAGNOSIS — D473 Essential (hemorrhagic) thrombocythemia: Secondary | ICD-10-CM | POA: Diagnosis present

## 2015-12-19 DIAGNOSIS — N179 Acute kidney failure, unspecified: Secondary | ICD-10-CM | POA: Diagnosis not present

## 2015-12-19 DIAGNOSIS — R531 Weakness: Secondary | ICD-10-CM | POA: Diagnosis not present

## 2015-12-19 DIAGNOSIS — Z79899 Other long term (current) drug therapy: Secondary | ICD-10-CM

## 2015-12-19 DIAGNOSIS — Z87891 Personal history of nicotine dependence: Secondary | ICD-10-CM | POA: Diagnosis not present

## 2015-12-19 DIAGNOSIS — A419 Sepsis, unspecified organism: Secondary | ICD-10-CM | POA: Diagnosis present

## 2015-12-19 DIAGNOSIS — E43 Unspecified severe protein-calorie malnutrition: Secondary | ICD-10-CM | POA: Diagnosis present

## 2015-12-19 DIAGNOSIS — E876 Hypokalemia: Secondary | ICD-10-CM | POA: Diagnosis present

## 2015-12-19 DIAGNOSIS — N178 Other acute kidney failure: Secondary | ICD-10-CM

## 2015-12-19 DIAGNOSIS — C229 Malignant neoplasm of liver, not specified as primary or secondary: Secondary | ICD-10-CM | POA: Diagnosis present

## 2015-12-19 DIAGNOSIS — Z923 Personal history of irradiation: Secondary | ICD-10-CM | POA: Diagnosis not present

## 2015-12-19 DIAGNOSIS — E860 Dehydration: Secondary | ICD-10-CM | POA: Diagnosis present

## 2015-12-19 DIAGNOSIS — J9811 Atelectasis: Secondary | ICD-10-CM | POA: Diagnosis present

## 2015-12-19 DIAGNOSIS — I2699 Other pulmonary embolism without acute cor pulmonale: Secondary | ICD-10-CM | POA: Diagnosis present

## 2015-12-19 DIAGNOSIS — D696 Thrombocytopenia, unspecified: Secondary | ICD-10-CM | POA: Diagnosis present

## 2015-12-19 DIAGNOSIS — C787 Secondary malignant neoplasm of liver and intrahepatic bile duct: Secondary | ICD-10-CM | POA: Diagnosis present

## 2015-12-19 DIAGNOSIS — Z9079 Acquired absence of other genital organ(s): Secondary | ICD-10-CM | POA: Diagnosis not present

## 2015-12-19 DIAGNOSIS — D72829 Elevated white blood cell count, unspecified: Secondary | ICD-10-CM | POA: Diagnosis present

## 2015-12-19 DIAGNOSIS — J189 Pneumonia, unspecified organism: Secondary | ICD-10-CM | POA: Diagnosis present

## 2015-12-19 DIAGNOSIS — Z809 Family history of malignant neoplasm, unspecified: Secondary | ICD-10-CM | POA: Diagnosis not present

## 2015-12-19 DIAGNOSIS — D630 Anemia in neoplastic disease: Secondary | ICD-10-CM | POA: Diagnosis present

## 2015-12-19 DIAGNOSIS — D75839 Thrombocytosis, unspecified: Secondary | ICD-10-CM | POA: Diagnosis present

## 2015-12-19 DIAGNOSIS — Z86711 Personal history of pulmonary embolism: Secondary | ICD-10-CM | POA: Diagnosis not present

## 2015-12-19 DIAGNOSIS — I723 Aneurysm of iliac artery: Secondary | ICD-10-CM | POA: Diagnosis present

## 2015-12-19 DIAGNOSIS — C61 Malignant neoplasm of prostate: Secondary | ICD-10-CM | POA: Diagnosis present

## 2015-12-19 DIAGNOSIS — D638 Anemia in other chronic diseases classified elsewhere: Secondary | ICD-10-CM | POA: Diagnosis not present

## 2015-12-19 LAB — TYPE AND SCREEN
ABO/RH(D): O POS
ANTIBODY SCREEN: NEGATIVE

## 2015-12-19 LAB — CBC WITH DIFFERENTIAL/PLATELET
BASOS PCT: 0 %
Basophils Absolute: 0 10*3/uL (ref 0.0–0.1)
EOS ABS: 0 10*3/uL (ref 0.0–0.7)
Eosinophils Relative: 0 %
HCT: 34.9 % — ABNORMAL LOW (ref 39.0–52.0)
HEMOGLOBIN: 11.6 g/dL — AB (ref 13.0–17.0)
Lymphocytes Relative: 13 %
Lymphs Abs: 2 10*3/uL (ref 0.7–4.0)
MCH: 32 pg (ref 26.0–34.0)
MCHC: 33.2 g/dL (ref 30.0–36.0)
MCV: 96.1 fL (ref 78.0–100.0)
Monocytes Absolute: 0.9 10*3/uL (ref 0.1–1.0)
Monocytes Relative: 6 %
NEUTROS PCT: 81 %
Neutro Abs: 12.5 10*3/uL — ABNORMAL HIGH (ref 1.7–7.7)
PLATELETS: 651 10*3/uL — AB (ref 150–400)
RBC: 3.63 MIL/uL — AB (ref 4.22–5.81)
RDW: 15.8 % — ABNORMAL HIGH (ref 11.5–15.5)
WBC: 15.4 10*3/uL — AB (ref 4.0–10.5)

## 2015-12-19 LAB — COMPREHENSIVE METABOLIC PANEL
ALBUMIN: 4.7 g/dL (ref 3.5–5.0)
ALK PHOS: 179 U/L — AB (ref 38–126)
ALT: 15 U/L — ABNORMAL LOW (ref 17–63)
ANION GAP: 28 — AB (ref 5–15)
AST: 32 U/L (ref 15–41)
BUN: 94 mg/dL — ABNORMAL HIGH (ref 6–20)
CALCIUM: 10.4 mg/dL — AB (ref 8.9–10.3)
CHLORIDE: 84 mmol/L — AB (ref 101–111)
CO2: 21 mmol/L — AB (ref 22–32)
Creatinine, Ser: 6.44 mg/dL — ABNORMAL HIGH (ref 0.61–1.24)
GFR calc Af Amer: 10 mL/min — ABNORMAL LOW (ref >60)
GFR calc non Af Amer: 9 mL/min — ABNORMAL LOW (ref >60)
GLUCOSE: 154 mg/dL — AB (ref 65–99)
Potassium: 3.2 mmol/L — ABNORMAL LOW (ref 3.5–5.1)
SODIUM: 133 mmol/L — AB (ref 135–145)
Total Bilirubin: 1.1 mg/dL (ref 0.3–1.2)
Total Protein: 10.5 g/dL — ABNORMAL HIGH (ref 6.5–8.1)

## 2015-12-19 LAB — URINALYSIS, ROUTINE W REFLEX MICROSCOPIC
Glucose, UA: NEGATIVE mg/dL
KETONES UR: NEGATIVE mg/dL
Leukocytes, UA: NEGATIVE
NITRITE: NEGATIVE
PROTEIN: 100 mg/dL — AB
SPECIFIC GRAVITY, URINE: 1.019 (ref 1.005–1.030)
pH: 5 (ref 5.0–8.0)

## 2015-12-19 LAB — BASIC METABOLIC PANEL
ANION GAP: 15 (ref 5–15)
BUN: 82 mg/dL — AB (ref 6–20)
CO2: 25 mmol/L (ref 22–32)
Calcium: 9.5 mg/dL (ref 8.9–10.3)
Chloride: 94 mmol/L — ABNORMAL LOW (ref 101–111)
Creatinine, Ser: 3.52 mg/dL — ABNORMAL HIGH (ref 0.61–1.24)
GFR, EST AFRICAN AMERICAN: 21 mL/min — AB (ref >60)
GFR, EST NON AFRICAN AMERICAN: 18 mL/min — AB (ref >60)
Glucose, Bld: 158 mg/dL — ABNORMAL HIGH (ref 65–99)
POTASSIUM: 2.9 mmol/L — AB (ref 3.5–5.1)
SODIUM: 134 mmol/L — AB (ref 135–145)

## 2015-12-19 LAB — PROTIME-INR
INR: 1.12 (ref 0.00–1.49)
Prothrombin Time: 14.5 seconds (ref 11.6–15.2)

## 2015-12-19 LAB — CREATININE, URINE, RANDOM: Creatinine, Urine: 198.92 mg/dL

## 2015-12-19 LAB — SODIUM, URINE, RANDOM: Sodium, Ur: 12 mmol/L

## 2015-12-19 LAB — LACTIC ACID, PLASMA: LACTIC ACID, VENOUS: 2.5 mmol/L — AB (ref 0.5–2.0)

## 2015-12-19 LAB — CBG MONITORING, ED: GLUCOSE-CAPILLARY: 130 mg/dL — AB (ref 65–99)

## 2015-12-19 LAB — PHOSPHORUS: PHOSPHORUS: 4.2 mg/dL (ref 2.5–4.6)

## 2015-12-19 LAB — MAGNESIUM: Magnesium: 2.3 mg/dL (ref 1.7–2.4)

## 2015-12-19 MED ORDER — SODIUM CHLORIDE 0.9 % IV BOLUS (SEPSIS)
250.0000 mL | Freq: Once | INTRAVENOUS | Status: AC
  Administered 2015-12-19: 250 mL via INTRAVENOUS

## 2015-12-19 MED ORDER — HEPARIN SODIUM (PORCINE) 5000 UNIT/ML IJ SOLN
5000.0000 [IU] | Freq: Three times a day (TID) | INTRAMUSCULAR | Status: DC
  Administered 2015-12-19 – 2015-12-21 (×6): 5000 [IU] via SUBCUTANEOUS
  Filled 2015-12-19 (×6): qty 1

## 2015-12-19 MED ORDER — SODIUM CHLORIDE 0.9 % IV SOLN
1000.0000 mL | Freq: Once | INTRAVENOUS | Status: AC
Start: 2015-12-19 — End: 2015-12-19
  Administered 2015-12-19: 1000 mL via INTRAVENOUS

## 2015-12-19 MED ORDER — SODIUM CHLORIDE 0.9 % IV SOLN
1000.0000 mL | INTRAVENOUS | Status: DC
  Administered 2015-12-19: 1000 mL via INTRAVENOUS

## 2015-12-19 MED ORDER — SODIUM CHLORIDE 0.9 % IV SOLN
INTRAVENOUS | Status: DC
  Administered 2015-12-19 – 2015-12-20 (×3): via INTRAVENOUS

## 2015-12-19 MED ORDER — ONDANSETRON HCL 4 MG PO TABS: 4.0000 mg | ORAL_TABLET | Freq: Four times a day (QID) | ORAL | Status: DC | PRN

## 2015-12-19 MED ORDER — SODIUM CHLORIDE 0.9 % IV SOLN
1000.0000 mL | Freq: Once | INTRAVENOUS | Status: AC
  Administered 2015-12-19: 1000 mL via INTRAVENOUS

## 2015-12-19 MED ORDER — SODIUM CHLORIDE 0.9 % IJ SOLN: 3.0000 mL | Freq: Two times a day (BID) | INTRAMUSCULAR | Status: DC

## 2015-12-19 MED ORDER — ONDANSETRON HCL 4 MG/2ML IJ SOLN
4.0000 mg | Freq: Four times a day (QID) | INTRAMUSCULAR | Status: DC | PRN
Start: 2015-12-19 — End: 2015-12-21

## 2015-12-19 MED ORDER — SODIUM CHLORIDE 0.9 % IJ SOLN
10.0000 mL | INTRAMUSCULAR | Status: DC | PRN
  Administered 2015-12-19 – 2015-12-21 (×3): 10 mL
  Filled 2015-12-19 (×3): qty 40

## 2015-12-19 MED ORDER — HYDROMORPHONE HCL 1 MG/ML IJ SOLN
1.0000 mg | INTRAMUSCULAR | Status: DC | PRN
Start: 2015-12-19 — End: 2015-12-21
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
## 2015-12-19 NOTE — ED Notes (Signed)
Patient here from home with complaints of syncopal episode this am. Weakness for the past 3 days, nausea, vomiting. Hx of stage 4 Liver CA. AAO x4

## 2015-12-19 NOTE — Progress Notes (Signed)
CRITICAL VALUE ALERT  Critical value received:  Lactic acid 2.5  Date of notification:  12/19/2015  Time of notification:  2118  Critical value read back:Yes.    Nurse who received alert:  Haywood Lasso RN  MD notified (1st page):  Baltazar Najjar  Time of first page:  2122  MD notified (2nd page):  Time of second page:  Responding MD:  Baltazar Najjar  Time MD responded:  2130, 250 ml bolus ordered. Will continue to monitor.

## 2015-12-19 NOTE — ED Notes (Signed)
MD at bedside. 

## 2015-12-19 NOTE — ED Notes (Signed)
Bed: RESA Expected date:  Expected time:  Means of arrival:  Comments: EMS  
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
## 2015-12-20 ENCOUNTER — Inpatient Hospital Stay (HOSPITAL_COMMUNITY): Payer: Medicare Other

## 2015-12-20 DIAGNOSIS — A419 Sepsis, unspecified organism: Secondary | ICD-10-CM

## 2015-12-20 LAB — URINE MICROSCOPIC-ADD ON

## 2015-12-20 LAB — CBC
HCT: 24.3 % — ABNORMAL LOW (ref 39.0–52.0)
HEMOGLOBIN: 8.3 g/dL — AB (ref 13.0–17.0)
MCH: 31.8 pg (ref 26.0–34.0)
MCHC: 34.2 g/dL (ref 30.0–36.0)
MCV: 93.1 fL (ref 78.0–100.0)
PLATELETS: 478 10*3/uL — AB (ref 150–400)
RBC: 2.61 MIL/uL — AB (ref 4.22–5.81)
RDW: 15.9 % — ABNORMAL HIGH (ref 11.5–15.5)
WBC: 9.9 10*3/uL (ref 4.0–10.5)

## 2015-12-20 LAB — BASIC METABOLIC PANEL
ANION GAP: 15 (ref 5–15)
BUN: 73 mg/dL — ABNORMAL HIGH (ref 6–20)
CALCIUM: 9.5 mg/dL (ref 8.9–10.3)
CO2: 26 mmol/L (ref 22–32)
CREATININE: 2.36 mg/dL — AB (ref 0.61–1.24)
Chloride: 96 mmol/L — ABNORMAL LOW (ref 101–111)
GFR, EST AFRICAN AMERICAN: 34 mL/min — AB (ref >60)
GFR, EST NON AFRICAN AMERICAN: 29 mL/min — AB (ref >60)
Glucose, Bld: 109 mg/dL — ABNORMAL HIGH (ref 65–99)
Potassium: 2.8 mmol/L — ABNORMAL LOW (ref 3.5–5.1)
SODIUM: 137 mmol/L (ref 135–145)

## 2015-12-20 LAB — LACTIC ACID, PLASMA
LACTIC ACID, VENOUS: 0.8 mmol/L (ref 0.5–2.0)
LACTIC ACID, VENOUS: 0.8 mmol/L (ref 0.5–2.0)

## 2015-12-20 LAB — PROCALCITONIN: PROCALCITONIN: 21.61 ng/mL

## 2015-12-20 MED ORDER — POTASSIUM CHLORIDE 20 MEQ/15ML (10%) PO SOLN
40.0000 meq | ORAL | Status: AC
  Administered 2015-12-20 (×3): 40 meq via ORAL
  Filled 2015-12-20 (×3): qty 30

## 2015-12-20 MED ORDER — PANTOPRAZOLE SODIUM 40 MG PO TBEC
40.0000 mg | DELAYED_RELEASE_TABLET | Freq: Every day | ORAL | Status: DC
Start: 2015-12-20 — End: 2015-12-21
  Administered 2015-12-20 – 2015-12-21 (×2): 40 mg via ORAL
  Filled 2015-12-20 (×2): qty 1

## 2015-12-20 MED ORDER — VANCOMYCIN HCL IN DEXTROSE 1-5 GM/200ML-% IV SOLN
1000.0000 mg | INTRAVENOUS | Status: DC
  Administered 2015-12-20 – 2015-12-21 (×2): 1000 mg via INTRAVENOUS
  Filled 2015-12-20 (×2): qty 200

## 2015-12-20 MED ORDER — ENSURE ENLIVE PO LIQD
237.0000 mL | ORAL | Status: DC
  Administered 2015-12-20: 237 mL via ORAL

## 2015-12-20 MED ORDER — PIPERACILLIN-TAZOBACTAM 3.375 G IVPB
3.3750 g | Freq: Three times a day (TID) | INTRAVENOUS | Status: DC
  Administered 2015-12-20 – 2015-12-21 (×4): 3.375 g via INTRAVENOUS
  Filled 2015-12-20 (×4): qty 50

## 2015-12-20 NOTE — Progress Notes (Signed)
Pt is active with the [facility] in [place], Therapist, sports) and [place] ([place], RN)called to inform them that pt was admitted to [facility].
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
## 2015-12-20 NOTE — Progress Notes (Signed)
[facility] PROGRESS NOTE  Nicholas Ayala [id number] DOB: 29-Aug-1961 DOA: 12/19/2015 PCP: PROVIDER NOT IN SYSTEM  Summary 12/20/15: I have seen and examined Nicholas Ayala at bedside and reviewed Nicholas chart. He has liver cancer and is undergoing chemotherapy at the [place]. He seems to be septic resulting in acute kidney injury. Renal ultrasound does not suggest hydronephrosis. Source of sepsis not clear. Will therefore obtain CT chest/abdomen/pelvis to localize infection and give empiric antibiotics. Will continue IVF. Renal function improving and patient hoping to go home soon. There are plans for chemotherapy on 12/31/15. He should have infection addressed before embarking on another chemo session. I discussed the care plan with patient's Ayala&Nicholas Ayala. Plan Sepsis (HCC)/Leukocytosis/Acute renal failure (ARF) (HCC)/Hypokalemia  Follow septic work up  CT chest/abdomen/pelvis in am  Broadspectrum antibiotics/IVF/Replenish electrolytes as necessary Malignant neoplasm of prostate (HCC)/Protein-calorie malnutrition, severe (HCC)/Prostate cancer (HCC)/Bilateral pulmonary embolism (HCC)/Cancer of liver (HCC)/Anemia of chronic disease/Thrombocytosis (HCC)/Hypercalcemia  No acute changes  Monitor Code Status: Full Code Family Communication: Nicholas Ayala and Ayala Disposition Plan: Home soon   Consultants:  None  Procedures:  None  Antibiotics:  Vancomycin 12/20/15  Zosyn 12/20/15>  HPI/Subjective: Has no complaints. Feels much better.  Objective: Filed Vitals:   12/20/15 1332 12/20/15 2159  BP: 110/71 107/84  Pulse: 79 81  Temp: 98.6 F (37 C) 98.1 F (36.7 C)  Resp: 18 18    Intake/Output Summary (Last 24 hours) at 12/20/15 2245 Last data filed at 12/20/15 2200  Gross per 24 hour  Intake 1596.25 ml  Output   1125 ml  Net 471.25 ml   Filed Weights   12/19/15 1619 12/20/15 0453  Weight: 69.7 kg (153 lb 10.6 oz) 70.5 kg (155 lb 6.8 oz)    Exam:   General:  Comfortable at  rest.  Cardiovascular: S1-S2 normal. No murmurs. Pulse regular.  Respiratory: Good air entry bilaterally. No rhonchi or rales.  Abdomen: Soft and nontender. Normal bowel sounds. No organomegaly.  Musculoskeletal: No pedal edema   Neurological: Intact  Data Reviewed: Basic Metabolic Panel:  Recent Labs Lab 12/19/15 0806 12/19/15 2040 12/20/15 0500  NA 133* 134* 137  K 3.2* 2.9* 2.8*  CL 84* 94* 96*  CO2 21* 25 26  GLUCOSE 154* 158* 109*  BUN 94* 82* 73*  CREATININE 6.44* 3.52* 2.36*  CALCIUM 10.4* 9.5 9.5  MG  --  2.3  --   PHOS  --  4.2  --    Liver Function Tests:  Recent Labs Lab 12/19/15 0806  AST 32  ALT 15*  ALKPHOS 179*  BILITOT 1.1  PROT 10.5*  ALBUMIN 4.7   No results for input(s): LIPASE, AMYLASE in the last 168 hours. No results for input(s): AMMONIA in the last 168 hours. CBC:  Recent Labs Lab 12/19/15 0806 12/20/15 0500  WBC 15.4* 9.9  NEUTROABS 12.5*  --   HGB 11.6* 8.3*  HCT 34.9* 24.3*  MCV 96.1 93.1  PLT 651* 478*   Cardiac Enzymes: No results for input(s): CKTOTAL, CKMB, CKMBINDEX, TROPONINI in the last 168 hours. BNP (last 3 results) No results for input(s): BNP in the last 8760 hours.  ProBNP (last 3 results) No results for input(s): PROBNP in the last 8760 hours.  CBG:  Recent Labs Lab 12/19/15 0802  GLUCAP 130*    Recent Results (from the past 240 hour(s))  Culture, blood (routine x 2)     Status: None (Preliminary result)   Collection Time: 12/20/15  7:30 AM  Result Value Ref Range  Status   Specimen Description BLOOD LEFT HAND  Final   Special Requests BOTTLES DRAWN AEROBIC AND ANAEROBIC 5CC  Final   Culture PENDING  Incomplete   Report Status PENDING  Incomplete  Culture, blood (routine x 2)     Status: None (Preliminary result)   Collection Time: 12/20/15  7:35 AM  Result Value Ref Range Status   Specimen Description BLOOD RIGHT HAND  Final   Special Requests BOTTLES DRAWN AEROBIC AND ANAEROBIC 20 CC  Final    Culture PENDING  Incomplete   Report Status PENDING  Incomplete     Studies: Dg Chest 2 View  12/19/2015  CLINICAL DATA:  Syncope.  History of prostate cancer. EXAM: CHEST  2 VIEW COMPARISON:  03/01/2014 FINDINGS: Porta catheter on the right with tip at the SVC level. Normal heart size and mediastinal contours. There is no edema, consolidation, effusion, or pneumothorax. IMPRESSION: No evidence of cardiopulmonary disease. Electronically Signed   By: Monte Fantasia M.D.   On: 12/19/2015 08:38   Ct Head Wo Contrast  12/19/2015  CLINICAL DATA:  Syncope.  History of liver carcinoma EXAM: CT HEAD WITHOUT CONTRAST TECHNIQUE: Contiguous axial images were obtained from the base of the skull through the vertex without intravenous contrast. COMPARISON:  February 26, 2014 FINDINGS: The ventricles are normal in size and configuration. There is no intracranial mass hemorrhage, extra-axial fluid collection, or midline shift. The gray-white compartments appear unremarkable. No acute infarct is evident. The bony calvarium appears intact. The visualized mastoid air cells are clear. Visualized orbital regions appear symmetric. IMPRESSION: No intracranial mass, hemorrhage, or 3 edema. The gray-white compartments appear unremarkable. No acute infarct evident. Electronically Signed   By: Lowella Grip III M.D.   On: 12/19/2015 08:50   US Renal  12/20/2015  CLINICAL DATA:  Acute renal failure. History of stage IV endocrine cancer. EXAM: RENAL / URINARY TRACT ULTRASOUND COMPLETE COMPARISON:  CT of 12/08/2014 FINDINGS: Right Kidney: Length: 4.9 cm.  Markedly atrophic, as on CT. Left Kidney: Length: 14.3 cm. No hydronephrosis. Normal in renal cortical thickness and echogenicity. Bladder: Appears normal for degree of bladder distention. IMPRESSION: 1. Markedly atrophic right kidney. 2. No left-sided hydronephrosis or other explanation for renal insufficiency. Electronically Signed   By: Abigail Miyamoto M.D.   On: 12/20/2015 08:15    Dg Chest Port 1 View  12/20/2015  CLINICAL DATA:  55 year old male with weakness. 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 Subsequent encounter. EXAM: PORTABLE CHEST 1 VIEW COMPARISON:  12/19/2015 and earlier. FINDINGS: Portable AP upright view at 0732 hours. Stable right chest porta cath, accessed. Normal cardiac size and mediastinal contours. Visualized tracheal air column is within normal limits. Lung volumes within normal limits. No pneumothorax, pulmonary edema, pleural effusion or confluent pulmonary opacity. No pulmonary nodule identified. Negative visible bowel gas. Stable visualized osseous structures. IMPRESSION: No acute cardiopulmonary abnormality. Electronically Signed   By: Genevie Ann M.D.   On: 12/20/2015 08:14    Scheduled Meds: . feeding supplement (ENSURE ENLIVE)  237 mL Oral Q24H  . heparin  5,000 Units Subcutaneous 3 times per day  . pantoprazole  40 mg Oral Daily  . piperacillin-tazobactam (ZOSYN)  IV  3.375 g Intravenous 3 times per day  . sodium chloride  3 mL Intravenous Q12H  . vancomycin  1,000 mg Intravenous Q24H   Continuous Infusions: . sodium chloride 75 mL/hr at 12/20/15 2143     Time spent: 25 minutes    Dexter Signor  [facility] Pager [phone].  If 7PM-7AM, please contact night-coverage at [email or web address], password [facility] 12/20/2015, 10:45 PM  LOS: 1 day

## 2015-12-20 NOTE — Progress Notes (Signed)
Initial Nutrition Assessment  DOCUMENTATION CODES:   Not applicable  INTERVENTION:  - Will order Ensure Enlive once/day, this supplement provides 350 kcal and 20 grams of protein - RD will continue to monitor for needs  NUTRITION DIAGNOSIS:   Increased nutrient needs related to catabolic illness, cancer and cancer related treatments as evidenced by estimated needs.  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 Metastatic), now presenting to [facility] ED with main concern of several days duration of progressively worsening fatigue and weakness, poor oral intake.  Pt seen for consult. BMI indicates normal weight. Pt states that since Monday (12/16/15) he has had a poor appetite. He also states that he was having nausea with associated vomiting during this time. He last felt nauseated yesterday (12/19/15) AM and last vomited on 12/18/15. Pt states that he was able to eat breakfast this AM and that for lunch he had fish and coleslaw. Pt states that he is hungry and does not feel he can wait until dinner to eat. Pt requesting a side salad with ranch dressing and a strawberry [place] ice; this would put him over his allotted Na, K, and fluids for lunch meal but this RD approved this to be sent for a snack given poor  intakes for several days PTA in addition to pt with cancer undergoing chemotherapy. In addition, Na WDL, K low based on labs this AM.  He states that due to chemo he has experienced taste alterations (bland taste in addition to a constant metallic taste). Pt denies anything making these sensations better or worse. He was drinking Ensure once/day PTA and is interested in continuing this regimen during admission. Pt likes chocolate and strawberry flavor and is willing to receive strawberry only while on Renal diet.   Physical assessment shows no muscle or fat wasting at this time. No weight hx available since 12/08/14 which that weight being 150 lbs and current weight of 155 lbs. Pt reports that throughout chemo his weight has been fluctuating and is typically between 163-170 lbs. Pt is unsure of when he last weighed an amount within this range. This indicates that pt has lost 8-15 lbs (5-9% body weight) in an unknown time frame.  Not meeting needs in the few days PTA but expect pt to improve intakes with resolution of N/V. Medications reviewed. Labs reviewed; K: 2.8 mmol/L, Cl: 96 mmol/L, BUN/creatinine elevated but trending down, GFR: 34.   Diet Order:  Diet renal with fluid restriction Fluid restriction:: 1200 mL Fluid; Room service appropriate?: Yes; Fluid consistency:: Thin  Skin:  Reviewed, no issues  Last BM:  PTA  Height:   Ht Readings from Last 1 Encounters:  12/19/15 5\' 7"  (1.702 m)    Weight:   Wt Readings from Last 1 Encounters:  12/20/15 155 lb 6.8 oz (70.5 kg)    Ideal  Body Weight:  67.27 kg (kg)  BMI:  Body mass index is 24.34 kg/(m^2).  Estimated Nutritional Needs:   Kcal:  2250-2450  Protein:  85-95 grams  Fluid:  1.2-1.5 L/day  EDUCATION NEEDS:   No education needs identified at this time     Jarome Matin, RD, LDN Inpatient Clinical Dietitian Pager # [phone] After hours/weekend pager # [phone]

## 2015-12-21 ENCOUNTER — Inpatient Hospital Stay (HOSPITAL_COMMUNITY): Payer: Medicare Other

## 2015-12-21 ENCOUNTER — Encounter (HOSPITAL_COMMUNITY): Payer: Self-pay | Admitting: Radiology

## 2015-12-21 LAB — COMPREHENSIVE METABOLIC PANEL
ALBUMIN: 3 g/dL — AB (ref 3.5–5.0)
ALK PHOS: 216 U/L — AB (ref 38–126)
ALT: 38 U/L (ref 17–63)
ANION GAP: 11 (ref 5–15)
AST: 76 U/L — ABNORMAL HIGH (ref 15–41)
BILIRUBIN TOTAL: 0.4 mg/dL (ref 0.3–1.2)
BUN: 36 mg/dL — AB (ref 6–20)
CALCIUM: 9.3 mg/dL (ref 8.9–10.3)
CO2: 24 mmol/L (ref 22–32)
CREATININE: 1.43 mg/dL — AB (ref 0.61–1.24)
Chloride: 104 mmol/L (ref 101–111)
GFR calc Af Amer: >60 mL/min (ref >60)
GFR calc non Af Amer: 53 mL/min — ABNORMAL LOW (ref >60)
GLUCOSE: 100 mg/dL — AB (ref 65–99)
Potassium: 3.5 mmol/L (ref 3.5–5.1)
Sodium: 139 mmol/L (ref 135–145)
TOTAL PROTEIN: 7.4 g/dL (ref 6.5–8.1)

## 2015-12-21 LAB — CBC WITH DIFFERENTIAL/PLATELET
BASOS PCT: 0 %
Basophils Absolute: 0 10*3/uL (ref 0.0–0.1)
Eosinophils Absolute: 0 10*3/uL (ref 0.0–0.7)
Eosinophils Relative: 1 %
HEMATOCRIT: 25.5 % — AB (ref 39.0–52.0)
HEMOGLOBIN: 8.3 g/dL — AB (ref 13.0–17.0)
LYMPHS ABS: 1.4 10*3/uL (ref 0.7–4.0)
Lymphocytes Relative: 18 %
MCH: 31.7 pg (ref 26.0–34.0)
MCHC: 32.5 g/dL (ref 30.0–36.0)
MCV: 97.3 fL (ref 78.0–100.0)
MONOS PCT: 12 %
Monocytes Absolute: 0.9 10*3/uL (ref 0.1–1.0)
NEUTROS ABS: 5.5 10*3/uL (ref 1.7–7.7)
NEUTROS PCT: 69 %
Platelets: 463 10*3/uL — ABNORMAL HIGH (ref 150–400)
RBC: 2.62 MIL/uL — AB (ref 4.22–5.81)
RDW: 15.8 % — ABNORMAL HIGH (ref 11.5–15.5)
WBC: 7.8 10*3/uL (ref 4.0–10.5)

## 2015-12-21 LAB — MAGNESIUM: MAGNESIUM: 1.8 mg/dL (ref 1.7–2.4)

## 2015-12-21 LAB — PHOSPHORUS: Phosphorus: 1.8 mg/dL — ABNORMAL LOW (ref 2.5–4.6)

## 2015-12-21 MED ORDER — HEPARIN SOD (PORK) LOCK FLUSH 100 UNIT/ML IV SOLN
500.0000 [IU] | INTRAVENOUS | Status: AC | PRN
  Administered 2015-12-21: 500 [IU]

## 2015-12-21 MED ORDER — POTASSIUM CHLORIDE 20 MEQ/15ML (10%) PO SOLN
40.0000 meq | Freq: Once | ORAL | Status: AC
  Administered 2015-12-21: 40 meq via ORAL
  Filled 2015-12-21: qty 30

## 2015-12-21 MED ORDER — IOHEXOL 300 MG/ML  SOLN
50.0000 mL | Freq: Once | INTRAMUSCULAR | Status: AC | PRN
  Administered 2015-12-21: 50 mL via ORAL

## 2015-12-21 MED ORDER — LEVOFLOXACIN 750 MG PO TABS: 750.0000 mg | ORAL_TABLET | Freq: Every day | ORAL | Status: AC

## 2015-12-21 MED ORDER — LEVOFLOXACIN 750 MG PO TABS: 750.0000 mg | ORAL_TABLET | Freq: Every day | ORAL | Status: DC

## 2015-12-21 NOTE — Discharge Summary (Signed)
Nicholas Ayala, is a 55 y.o. male  DOB June 08, 1961  MRN [id number].  Admission date:  12/19/2015  Admitting Physician  Theodis Blaze, MD  Discharge Date:  12/21/2015   Primary MD  PROVIDER NOT IN SYSTEM  Recommendations for primary care physician for things to follow:  Please follow complete resolution for acute kidney injury and ensure infection addressed before patient resumes chemotherapy   Admission Diagnosis   Acute renal failure, unspecified acute renal failure type [facility]) [N17.9]   Discharge Diagnosis  Acute renal failure, unspecified acute renal failure type ([place]) [N17.9]   Active Problems:   Malignant neoplasm of prostate ([place])   Protein-calorie malnutrition, severe ([place])   Prostate cancer ([place])   Bilateral pulmonary embolism ([place])   Cancer of liver ([place])   Anemia of chronic disease   Thrombocytosis ([place])   Hypercalcemia      Hospital Course  Thorin Rutland is a very pleasant is 55 yo male with known prostate cancer, status post prostatectomy, radiation therapy, recent diagnosis of liver and colon cancer (follows at [facility]) per patient he has neuroendocrine tumor, s/p recent chemotherapy mid December 2016, history of PE (was on coumadin and Lovenox but that was stopped due to GI bleed), Recent admission and discharge 12/12/2015 (hospitalized for melanotic stools and febrile neutropenia, has EGD on last admission 12/08/2014 with findings of lesions in the antrum and duodenum ? Metastatic), who presented to [facility] ED with complaints of several days duration of progressively worsening fatigue and weakness, poor oral intake and he was found to have suggestion of sepsis with white count of 15,000, hypotension/acute kidney injury but no clear source prompting CT chest abdomen and pelvis which showed "1. Innumerable  subcentimeter pulmonary nodules scattered throughout both lungs, new since 03/05/2014, in keeping with pulmonary metastases. 2. Mixed interval treatment response in the liver metastases, with decreased size of the dominant infiltrative left liver lobe metastasis, and with increased size of multiple smaller right liver lobe metastases. 3. Irregular wall thickening throughout the rectum, poorly characterized on this noncontrast CT, similar to the 12/08/2014 CT study, likely representing infiltrative tumor in the rectal wall. 4. Mild bibasilar atelectasis. No acute consolidative airspace disease. 5. No evidence of bowel obstruction, acute bowel inflammation or bowel perforation. No intra- abdominal fluid collections. 6. 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 Numerous additional chronic findings as above". Patient was hydrated and started on broad-spectrum antibiotics. Blood cultures remain negative at time of discharge. He probably has postobstructive pneumonia and we'll therefore continue Levaquin for 5 more days. His renal function has improved with BUN/creatinine of 36/1.43 at the time of discharge. He is to follow with oncology at the [facility] to resume chemotherapy on 12/31/2015. Will defer management of cancer to his primary care providers. However would ensure that infection is adequately addressed prior to resuming chemotherapy. Patient is eager to go home, therefore he will discharge in stable condition to follow with his primary care providers as scheduled.   Discharge Condition Stable.  Consults obtained  None  Follow UP Oncology   Discharge Instructions  and  Discharge Medications  Discharge Instructions    Diet - low sodium heart healthy    Complete by:  As directed      Increase activity slowly    Complete by:  As directed             Medication List    TAKE these medications        allopurinol 300 MG tablet  Commonly known as:  ZYLOPRIM  Take 300 mg by mouth daily.  Reported on 12/19/2015     atorvastatin 80 MG tablet  Commonly known as:  LIPITOR  Take 80 mg by mouth daily.     calcium-vitamin D 250-125 MG-UNIT tablet  Commonly known as:  OSCAL  Take 1 tablet by mouth 2 (two) times daily.     feeding supplement (ENSURE COMPLETE) Liqd  Take 237 mLs by mouth 2 (two) times daily between meals.     levofloxacin 750 MG tablet  Commonly known as:  LEVAQUIN  Take 1 tablet (750 mg total) by mouth daily.     ondansetron 8 MG tablet  Commonly known as:  ZOFRAN  Take 8 mg by mouth every 8 (eight) hours as needed for nausea or vomiting.     oxyCODONE 5 MG immediate release tablet  Commonly known as:  Oxy IR/ROXICODONE  Take 5-10 mg by mouth every 6 (six) hours as needed (shoulder pain).     pantoprazole 40 MG tablet  Commonly known as:  PROTONIX  Take 1 tablet (40 mg total) by mouth 2 (two) times daily.     Potassium Chloride ER 20 MEQ Tbcr  Take 20 mEq by mouth 2 (two) times daily.     PRESCRIPTION MEDICATION  Chemo [place]     PRESCRIPTION MEDICATION  Inject 300 mcg as directed daily. Filgrastim 0.3mg /ml inj.  Inject 29ml (342mcg) under the skin every day for 7 days starting 24 hours after chemotherapy     sildenafil 100 MG tablet  Commonly known as:  VIAGRA  Take 100 mg by mouth daily as needed for erectile dysfunction.     traZODone 50 MG tablet  Commonly known as:  DESYREL  Take 50 mg by mouth at bedtime.        Diet and Activity recommendation: See Discharge Instructions above  Major procedures and Radiology Reports - PLEASE review detailed and final reports for all details, in brief -    Ct Abdomen Pelvis Wo Contrast  12/21/2015  CLINICAL DATA:  Inpatient with prostate cancer diagnosed in 2011 status post prostatectomy, radiation therapy and chemotherapy. Recent GI bleed. Sepsis. EXAM: CT CHEST, ABDOMEN AND PELVIS WITHOUT CONTRAST TECHNIQUE: Multidetector CT imaging of the chest, abdomen and pelvis was performed following the  standard protocol without IV contrast. COMPARISON:  12/08/2014 CT abdomen/ pelvis. 03/05/2014 chest CT angiogram. 12/20/2015 chest radiograph. FINDINGS: CT CHEST Mediastinum/Nodes: Normal heart size. No pericardial fluid/thickening. Coronary atherosclerosis. Right internal jugular MediPort terminates at the cavoatrial junction. Great vessels are normal in course and caliber. Normal visualized thyroid. Normal esophagus. No pathologically enlarged axillary, mediastinal or gross hilar lymph nodes, noting limited sensitivity for the detection of hilar adenopathy on this noncontrast study. Lungs/Pleura: No pneumothorax. No pleural effusion. Mild medial right apical bullous disease, slightly increased. There are innumerable (> than 20) scattered subcentimeter solid pulmonary nodules throughout both lungs, largest 8 mm in the basilar right upper lobe (series 4/ image 29) and 8 mm in the medial right lower lobe (  series 4/ image 42), all of which appear new compared to the most recent available chest CT from 03/05/2014. There is mild subsegmental atelectasis in both lower lobes. No acute consolidative airspace disease. Musculoskeletal: No aggressive appearing focal osseous lesions. Mild symmetric gynecomastia, increased bilaterally. CT ABDOMEN AND PELVIS Hepatobiliary: There is a large infiltrative 12.9 x 7.4 cm liver mass in the left liver lobe (series 2/image 50), decreased from 18.0 x 9.8 cm on 12/08/2014. There are at least 10 additional scattered hypodense liver masses throughout the right liver lobe, most of which appear new/ increased, for example a peripheral 3.5 x 3.3 cm segment 8 right liver lobe mass (series 2/image 50) and a 3.5 x 2.6 cm segment 6 right liver lobe mass (series 2/ image 53), which were not definitely seen on 12/08/2014. Focal 4 mm calcification in the dependent gallbladder could represent a layering gallstone versus gallbladder wall calcification. No gallbladder distention or acute pericholecystic  fluid or new gallbladder wall thickening (there is chronic infiltration of the gallbladder wall by the infiltrative left lower lobe tumor, which appears decreased since 12/08/2014). No biliary ductal dilatation. Pancreas: Normal, with no mass or duct dilation. Spleen: Normal size. No mass. Adrenals/Urinary Tract: Normal adrenals. Stable severely atrophic right kidney with no right hydronephrosis. Stable compensatory hypertrophy of the left kidney with no left hydronephrosis, no left renal stones and no contour deforming renal masses. Normal bladder. Stomach/Bowel: Grossly normal stomach. Normal caliber small bowel with no small bowel wall thickening. Normal appendix. Oral contrast progresses to the descending colon. No colonic wall thickening or pericolonic fat stranding. There is irregular wall thickening throughout the rectum, not well-visualized on this noncontrast study, similar to the 12/08/2014 CT, likely representing infiltrative tumor. 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 No pathologically enlarged lymph nodes in the abdomen or pelvis. Reproductive: Status post prostatectomy. No mass or fluid collection in the prostatectomy bed. Other: No pneumoperitoneum, ascites or focal fluid collection. Musculoskeletal: Stable healed deformity in the medial inferior right pubic ramus. IMPRESSION: 1. Innumerable subcentimeter pulmonary nodules scattered throughout both lungs, new since 03/05/2014, in keeping with pulmonary metastases. 2. Mixed interval treatment response in the liver metastases, with decreased size of the dominant infiltrative left liver lobe metastasis, and with increased size of multiple smaller right liver lobe metastases. 3. Irregular wall thickening throughout the rectum, poorly characterized on this noncontrast CT, similar to the 12/08/2014 CT study, likely representing infiltrative tumor in the rectal wall. 4. Mild  bibasilar atelectasis. No acute consolidative airspace disease. 5. No evidence of bowel obstruction, acute bowel inflammation or bowel perforation. No intra- abdominal fluid collections. 6. 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 Numerous additional chronic findings as above. Electronically Signed   By: Ilona Sorrel M.D.   On: 12/21/2015 09:23   Dg Chest 2 View  12/19/2015  CLINICAL DATA:  Syncope.  History of prostate cancer. EXAM: CHEST  2 VIEW COMPARISON:  03/01/2014 FINDINGS: Porta catheter on the right with tip at the SVC level. Normal heart size and mediastinal contours. There is no edema, consolidation, effusion, or pneumothorax. IMPRESSION: No evidence of cardiopulmonary disease. Electronically Signed   By: Monte Fantasia M.D.   On: 12/19/2015 08:38   Ct Head Wo Contrast  12/19/2015  CLINICAL DATA:  Syncope.  History of liver carcinoma EXAM: CT HEAD WITHOUT CONTRAST TECHNIQUE: Contiguous axial images were obtained from the base of the skull through the vertex without intravenous contrast. COMPARISON:  February 26, 2014  FINDINGS: The ventricles are normal in size and configuration. There is no intracranial mass hemorrhage, extra-axial fluid collection, or midline shift. The gray-white compartments appear unremarkable. No acute infarct is evident. The bony calvarium appears intact. The visualized mastoid air cells are clear. Visualized orbital regions appear symmetric. IMPRESSION: No intracranial mass, hemorrhage, or 3 edema. The gray-white compartments appear unremarkable. No acute infarct evident. Electronically Signed   By: Lowella Grip III M.D.   On: 12/19/2015 08:50   Ct Chest Wo Contrast  12/21/2015  CLINICAL DATA:  Inpatient with prostate cancer diagnosed in 2011 status post prostatectomy, radiation therapy and chemotherapy. Recent GI bleed. Sepsis. EXAM: CT CHEST, ABDOMEN AND PELVIS WITHOUT CONTRAST TECHNIQUE: Multidetector CT imaging of the chest, abdomen and pelvis was performed  following the standard protocol without IV contrast. COMPARISON:  12/08/2014 CT abdomen/ pelvis. 03/05/2014 chest CT angiogram. 12/20/2015 chest radiograph. FINDINGS: CT CHEST Mediastinum/Nodes: Normal heart size. No pericardial fluid/thickening. Coronary atherosclerosis. Right internal jugular MediPort terminates at the cavoatrial junction. Great vessels are normal in course and caliber. Normal visualized thyroid. Normal esophagus. No pathologically enlarged axillary, mediastinal or gross hilar lymph nodes, noting limited sensitivity for the detection of hilar adenopathy on this noncontrast study. Lungs/Pleura: No pneumothorax. No pleural effusion. Mild medial right apical bullous disease, slightly increased. There are innumerable (> than 20) scattered subcentimeter solid pulmonary nodules throughout both lungs, largest 8 mm in the basilar right upper lobe (series 4/ image 29) and 8 mm in the medial right lower lobe (series 4/ image 42), all of which appear new compared to the most recent available chest CT from 03/05/2014. There is mild subsegmental atelectasis in both lower lobes. No acute consolidative airspace disease. Musculoskeletal: No aggressive appearing focal osseous lesions. Mild symmetric gynecomastia, increased bilaterally. CT ABDOMEN AND PELVIS Hepatobiliary: There is a large infiltrative 12.9 x 7.4 cm liver mass in the left liver lobe (series 2/image 50), decreased from 18.0 x 9.8 cm on 12/08/2014. There are at least 10 additional scattered hypodense liver masses throughout the right liver lobe, most of which appear new/ increased, for example a peripheral 3.5 x 3.3 cm segment 8 right liver lobe mass (series 2/image 50) and a 3.5 x 2.6 cm segment 6 right liver lobe mass (series 2/ image 53), which were not definitely seen on 12/08/2014. Focal 4 mm calcification in the dependent gallbladder could represent a layering gallstone versus gallbladder wall calcification. No gallbladder distention or acute  pericholecystic fluid or new gallbladder wall thickening (there is chronic infiltration of the gallbladder wall by the infiltrative left lower lobe tumor, which appears decreased since 12/08/2014). No biliary ductal dilatation. Pancreas: Normal, with no mass or duct dilation. Spleen: Normal size. No mass. Adrenals/Urinary Tract: Normal adrenals. Stable severely atrophic right kidney with no right hydronephrosis. Stable compensatory hypertrophy of the left kidney with no left hydronephrosis, no left renal stones and no contour deforming renal masses. Normal bladder. Stomach/Bowel: Grossly normal stomach. Normal caliber small bowel with no small bowel wall thickening. Normal appendix. Oral contrast progresses to the descending colon. No colonic wall thickening or pericolonic fat stranding. There is irregular wall thickening throughout the rectum, not well-visualized on this noncontrast study, similar to the 12/08/2014 CT, likely representing infiltrative tumor. 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 No pathologically enlarged lymph nodes in the abdomen or pelvis. Reproductive: Status post prostatectomy. No mass or fluid collection in the prostatectomy bed. Other: No pneumoperitoneum, ascites or focal fluid collection. Musculoskeletal: Stable healed  deformity in the medial inferior right pubic ramus. IMPRESSION: 1. Innumerable subcentimeter pulmonary nodules scattered throughout both lungs, new since 03/05/2014, in keeping with pulmonary metastases. 2. Mixed interval treatment response in the liver metastases, with decreased size of the dominant infiltrative left liver lobe metastasis, and with increased size of multiple smaller right liver lobe metastases. 3. Irregular wall thickening throughout the rectum, poorly characterized on this noncontrast CT, similar to the 12/08/2014 CT study, likely representing infiltrative tumor in the rectal  wall. 4. Mild bibasilar atelectasis. No acute consolidative airspace disease. 5. No evidence of bowel obstruction, acute bowel inflammation or bowel perforation. No intra- abdominal fluid collections. 6. 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 Numerous additional chronic findings as above. Electronically Signed   By: Ilona Sorrel M.D.   On: 12/21/2015 09:23   US Renal  12/20/2015  CLINICAL DATA:  Acute renal failure. History of stage IV endocrine cancer. EXAM: RENAL / URINARY TRACT ULTRASOUND COMPLETE COMPARISON:  CT of 12/08/2014 FINDINGS: Right Kidney: Length: 4.9 cm.  Markedly atrophic, as on CT. Left Kidney: Length: 14.3 cm. No hydronephrosis. Normal in renal cortical thickness and echogenicity. Bladder: Appears normal for degree of bladder distention. IMPRESSION: 1. Markedly atrophic right kidney. 2. No left-sided hydronephrosis or other explanation for renal insufficiency. Electronically Signed   By: Abigail Miyamoto M.D.   On: 12/20/2015 08:15   Dg Chest Port 1 View  12/20/2015  CLINICAL DATA:  55 year old male with weakness. 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 Subsequent encounter. EXAM: PORTABLE CHEST 1 VIEW COMPARISON:  12/19/2015 and earlier. FINDINGS: Portable AP upright view at 0732 hours. Stable right chest porta cath, accessed. Normal cardiac size and mediastinal contours. Visualized tracheal air column is within normal limits. Lung volumes within normal limits. No pneumothorax, pulmonary edema, pleural effusion or confluent pulmonary opacity. No pulmonary nodule identified. Negative visible bowel gas. Stable visualized osseous structures. IMPRESSION: No acute cardiopulmonary abnormality. Electronically Signed   By: Genevie Ann M.D.   On: 12/20/2015 08:14    Micro Results   Recent Results (from the past 240 hour(s))  Culture, blood (routine x 2)     Status: None (Preliminary result)   Collection Time: 12/20/15  7:30 AM  Result Value Ref Range Status   Specimen Description  BLOOD LEFT HAND  Final   Special Requests BOTTLES DRAWN AEROBIC AND ANAEROBIC 5CC  Final   Culture   Final    NO GROWTH < 24 HOURS Performed at [facility]    Report Status PENDING  Incomplete  Culture, blood (routine x 2)     Status: None (Preliminary result)   Collection Time: 12/20/15  7:35 AM  Result Value Ref Range Status   Specimen Description BLOOD RIGHT HAND  Final   Special Requests BOTTLES DRAWN AEROBIC AND ANAEROBIC 55 CC  Final   Culture   Final    NO GROWTH < 24 HOURS Performed at [facility]    Report Status PENDING  Incomplete       Today   Subjective:   Otis Brace today has no headache,no chest abdominal pain,no new weakness tingling or numbness, feels much better wants to go home today.   Objective:   Blood pressure 107/60, pulse 74, temperature 98.1 F (36.7 C), temperature source Oral, resp. rate 20, height 5\' 7"  (1.702 m), weight 70.3 kg (154 lb 15.7 oz), SpO2 100 %.   Intake/Output Summary (Last 24 hours) at 12/21/15 1352 Last data filed at 12/21/15 1347  Gross per 24 hour  Intake 2793.75 ml  Output   1850 ml  Net 943.75 ml    Exam Awake Alert, Oriented x 3, No new F.N deficits, Normal affect [place].AT,PERRAL Supple Neck,No JVD, No cervical lymphadenopathy appriciated.  Symmetrical Chest wall movement, Good air movement bilaterally, CTAB RRR,No Gallops,Rubs or new Murmurs, No Parasternal Heave +ve B.Sounds, Abd Soft, Non tender, No organomegaly appriciated, No rebound -guarding or rigidity. No Cyanosis, Clubbing or edema, No new Rash or bruise  Data Review   CBC w Diff: Lab Results  Component Value Date   WBC 7.8 12/21/2015   HGB 8.3* 12/21/2015   HCT 25.5* 12/21/2015   PLT 463* 12/21/2015   LYMPHOPCT 18 12/21/2015   MONOPCT 12 12/21/2015   EOSPCT 1 12/21/2015   BASOPCT 0 12/21/2015    CMP: Lab Results  Component Value Date   NA 139 12/21/2015   K 3.5 12/21/2015   CL 104 12/21/2015   CO2 24 12/21/2015   BUN 36*  12/21/2015   CREATININE 1.43* 12/21/2015   PROT 7.4 12/21/2015   ALBUMIN 3.0* 12/21/2015   BILITOT 0.4 12/21/2015   ALKPHOS 216* 12/21/2015   AST 76* 12/21/2015   ALT 38 12/21/2015  .   Total Time in preparing paper work, data evaluation and todays exam - 35 minutes  Nazario Russom M.D on 12/21/2015 at [place]  [phone]

## 2015-12-21 NOTE — Progress Notes (Signed)
Reviewed discharge information with patient . Answered all questions. Pt able to teach back medications and reasons to contact MD/911. Patient verbalizes importance of PCP follow up appointment.  Tarika Mckethan M. [place] Deckard, RN  

## 2015-12-25 LAB — CULTURE, BLOOD (ROUTINE X 2)
Culture: NO GROWTH
Culture: NO GROWTH

## 2016-01-07 NOTE — Progress Notes (Signed)
Vaughan Basta, [employer], [place] [phone]) called from the [facility] for D/C Summary.  Pt has been admitted to [place], [place].

## 2016-02-12 DEATH — deceased

## 2017-07-08 IMAGING — CT CT HEAD W/O CM
2 series · 16 of 30 positions shown, 20 images · non-contrast
Comparison: February 26, 2014

CLINICAL DATA: Syncope.  History of liver carcinoma

EXAM:
CT HEAD WITHOUT CONTRAST
TECHNIQUE: Contiguous axial images were obtained from the base of the skull
through the vertex without intravenous contrast.

[Series 2: head w/o · axial · non-contrast · 0.48mm/px · z∈[-158,-38]mm · 13 of 28 slices shown, 17 images]
[im 2/28  brain]
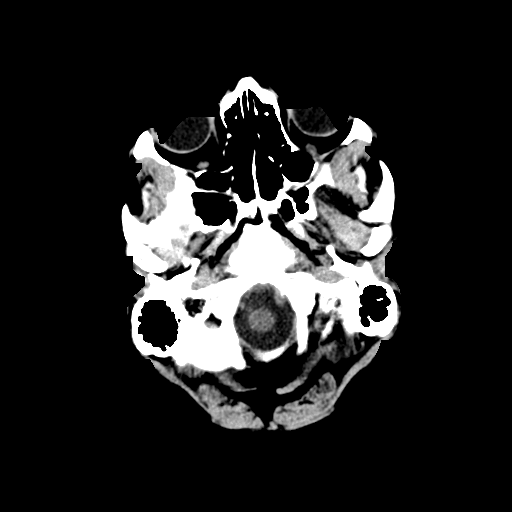
[im 2/28  bone]
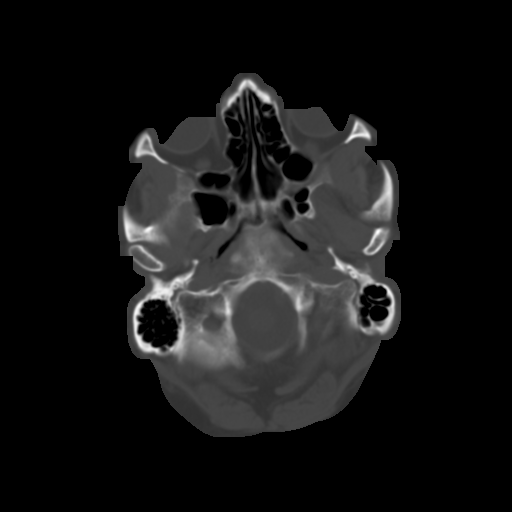
[im 4/28  brain]
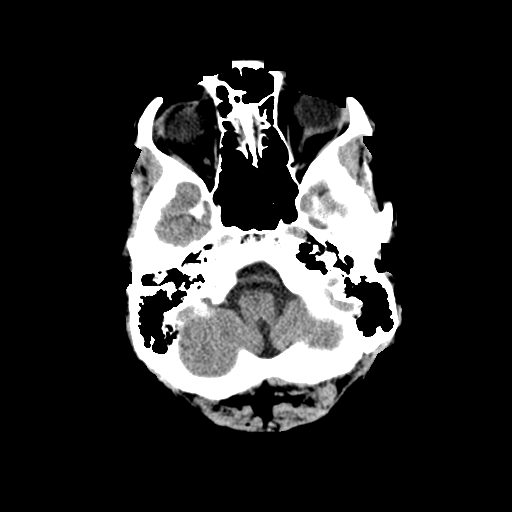
[im 6/28  brain]
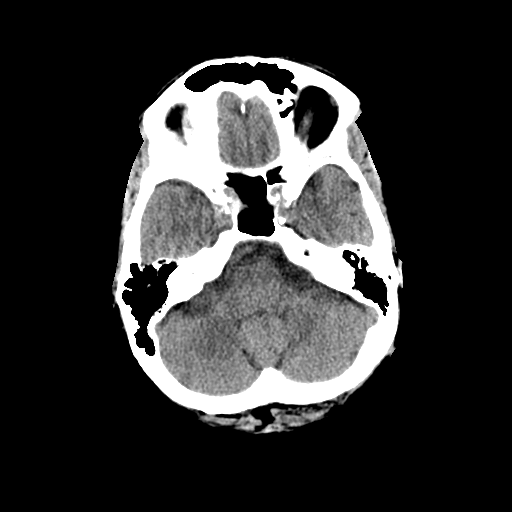
[im 8/28  brain]
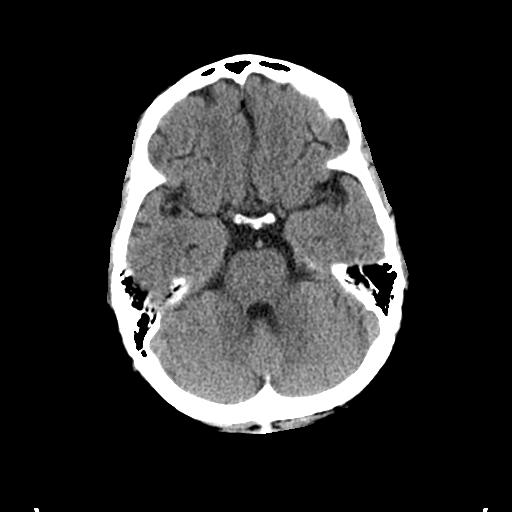
[im 10/28  brain]
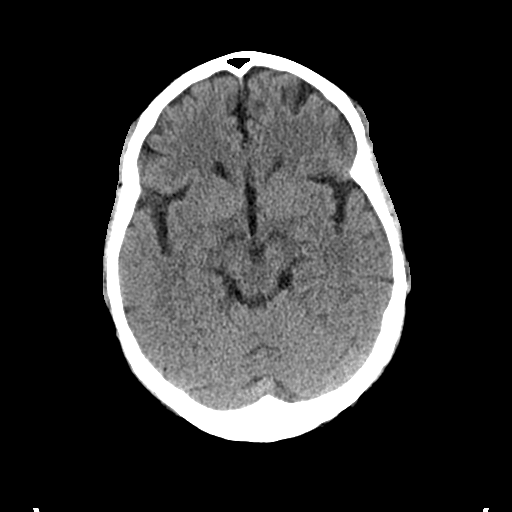
[im 10/28  bone]
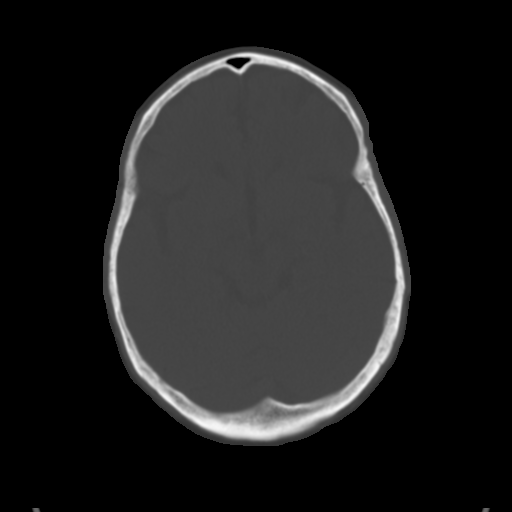
[im 12/28  brain]
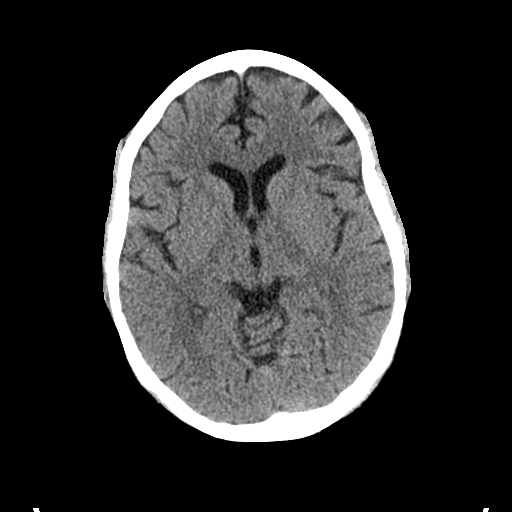
[im 14/28  brain]
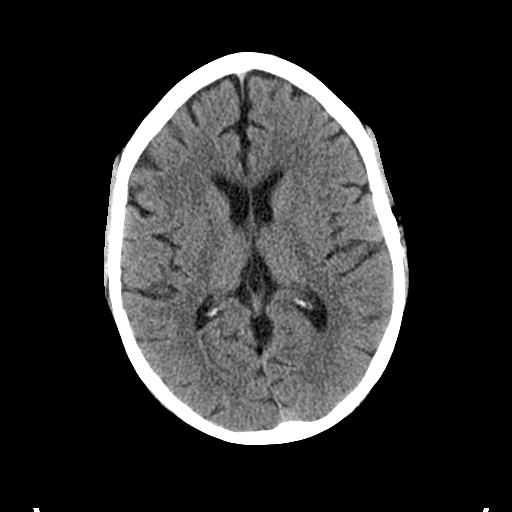
[im 16/28  brain]
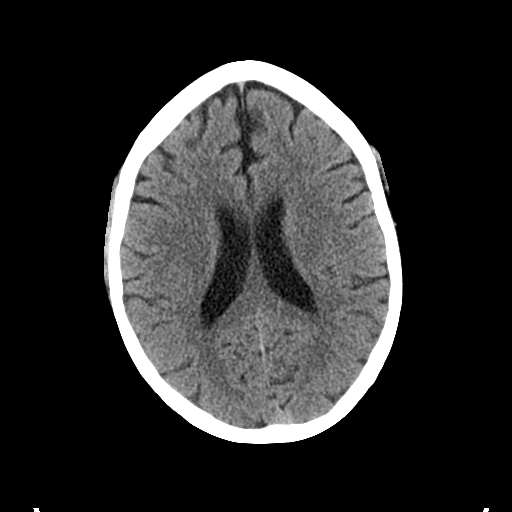
[im 18/28  brain]
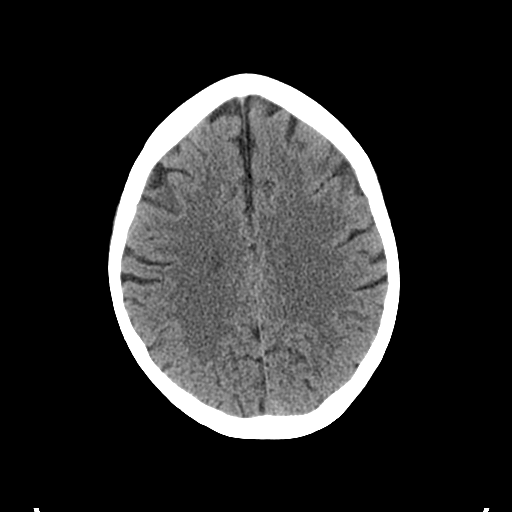
[im 18/28  bone]
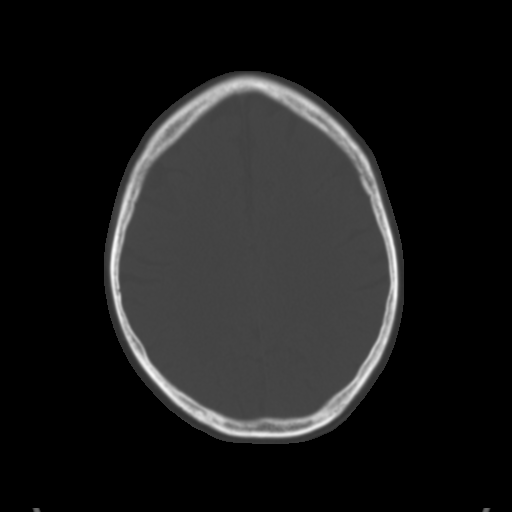
[im 20/28  brain]
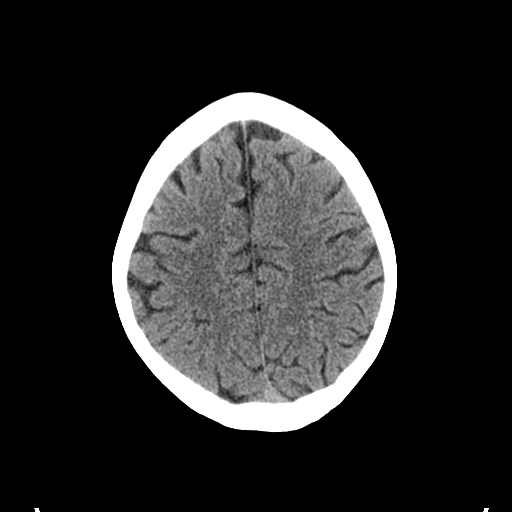
[im 22/28  brain]
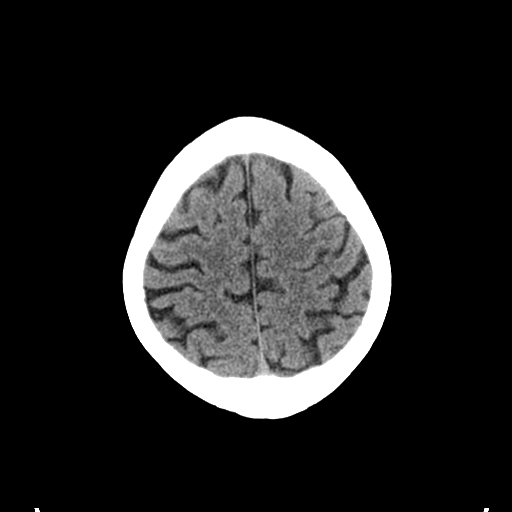
[im 24/28  brain]
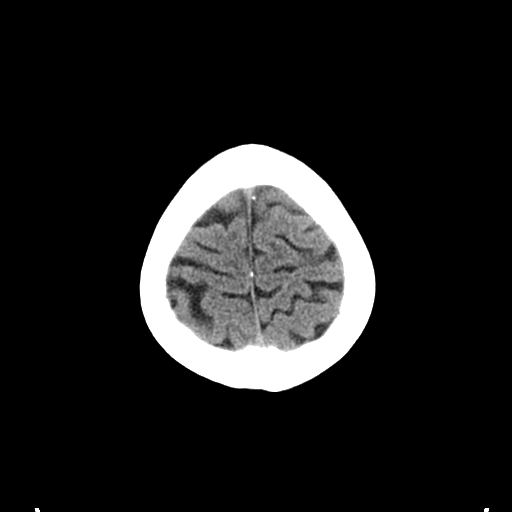
[im 26/28  brain]
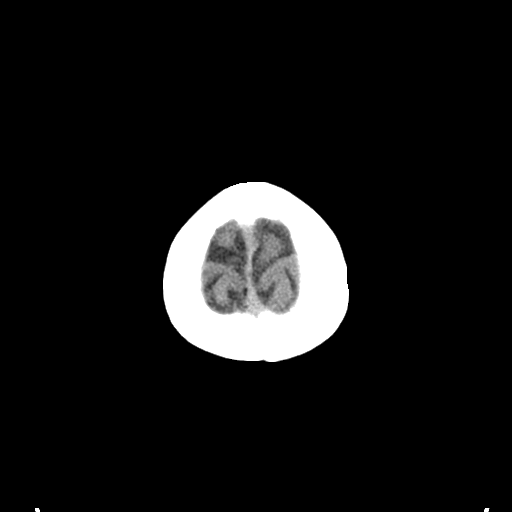
[im 26/28  bone]
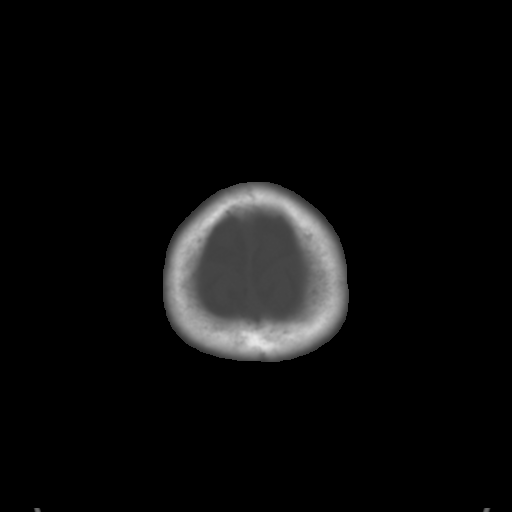

[Series 3: bone windows · axial · 0.48mm/px · z∈[-158,-118]mm · 3 of 28 slices shown]
[im 2/28  bone]
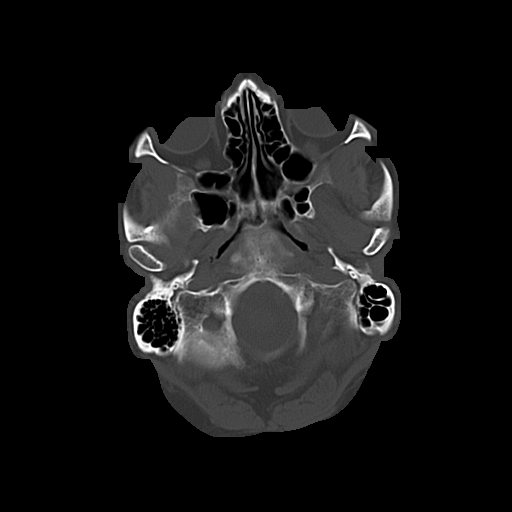
[im 6/28  bone]
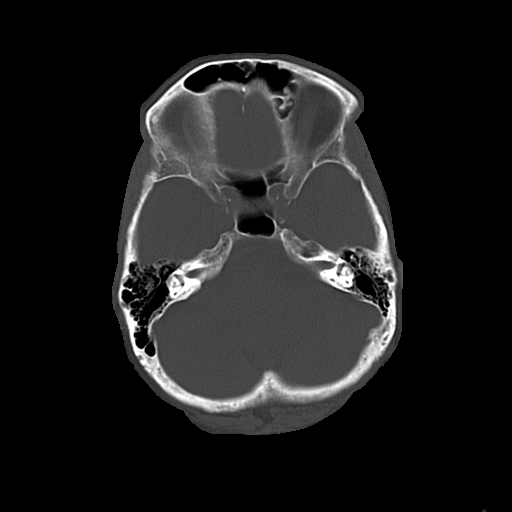
[im 10/28  bone]
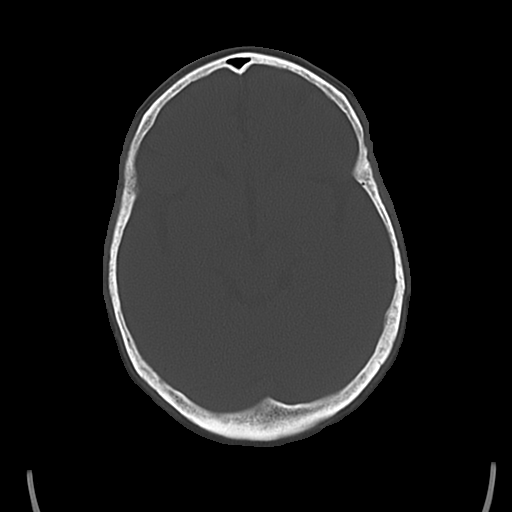

[16 of 30 positions shown; findings below may reference images not displayed]

FINDINGS: The ventricles are normal in size and configuration. There is no
intracranial mass hemorrhage, extra-axial fluid collection, or
midline shift. The gray-white compartments appear unremarkable. No
acute infarct is evident. The bony calvarium appears intact. The
visualized mastoid air cells are clear. Visualized orbital regions
appear symmetric.
IMPRESSION: No intracranial mass, hemorrhage, or 3 edema. The gray-white
compartments appear unremarkable. No acute infarct evident.

## 2017-07-09 IMAGING — DX DG CHEST 1V PORT
1 series · 1 of 1 positions shown · non-contrast
Comparison: 12/19/2015 and earlier.

CLINICAL DATA: 54-year-old male with weakness. Stage IV endocrine
cancer with liver metastases. Subsequent encounter.

EXAM:
PORTABLE CHEST 1 VIEW

[chest ap]
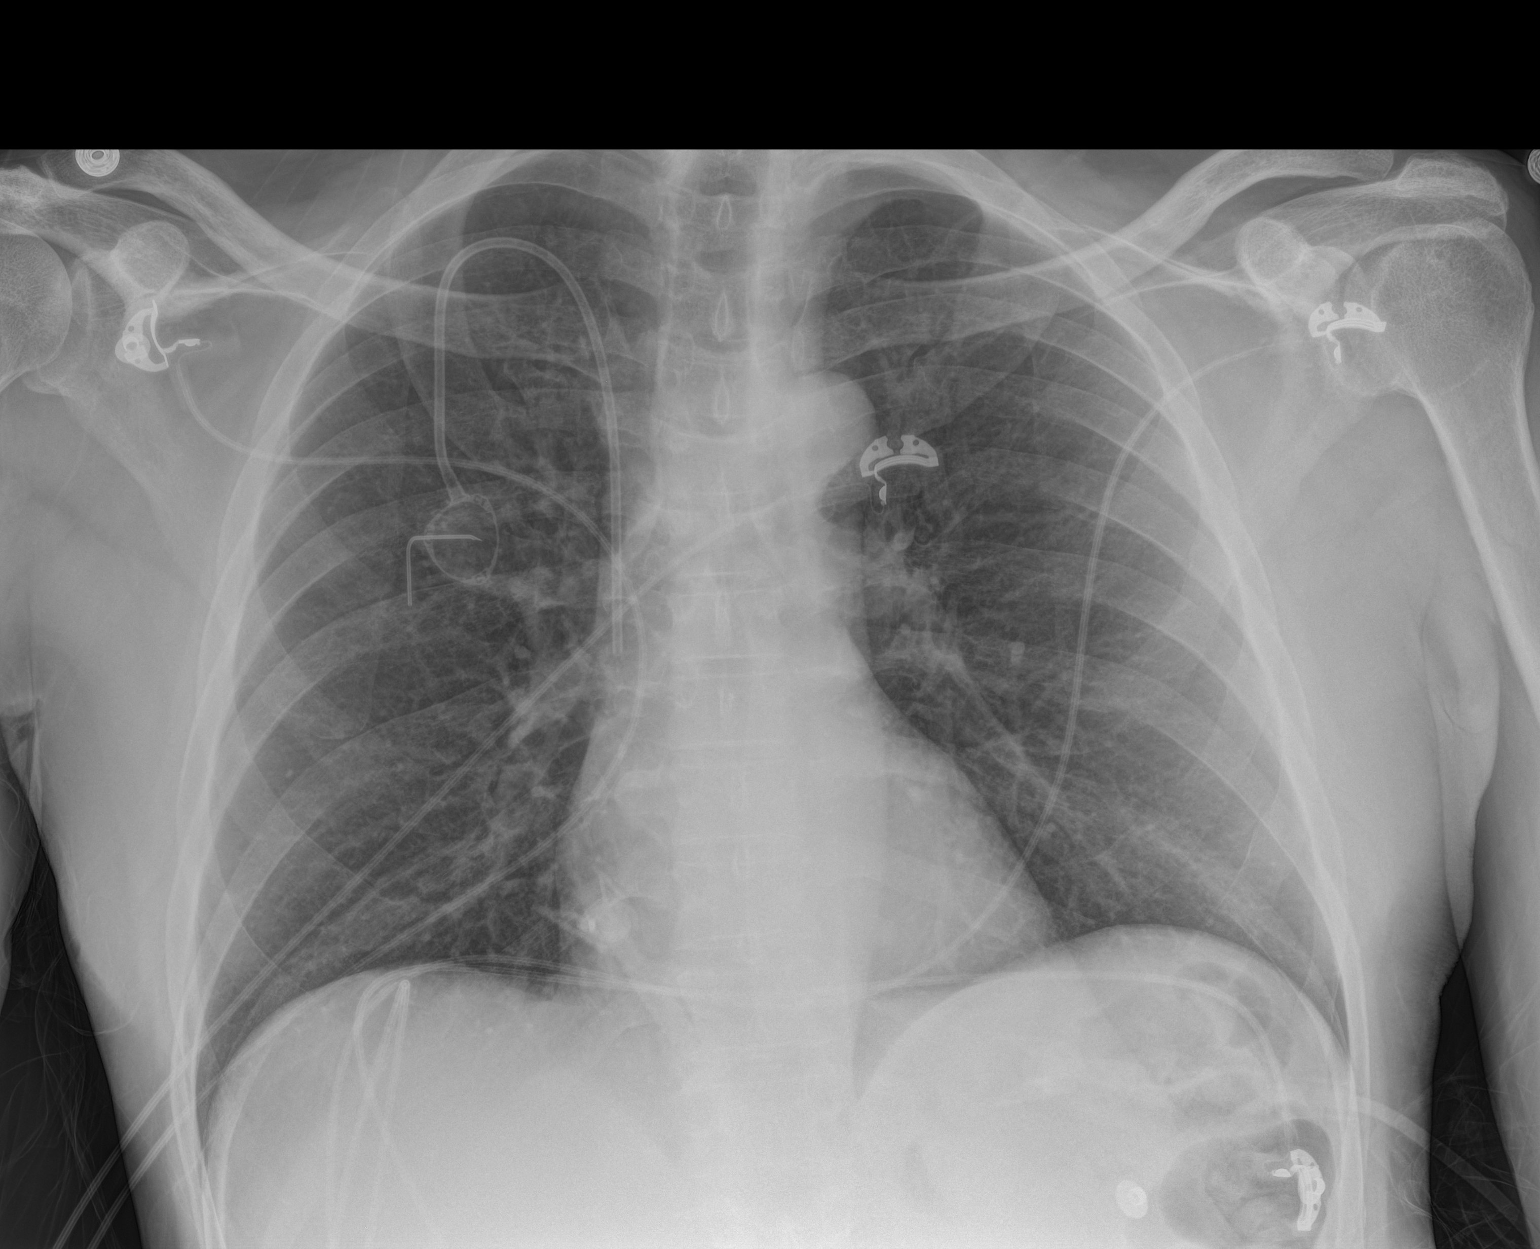

[1 of 1 positions shown; findings below may reference images not displayed]

FINDINGS: Portable AP upright view at 9406 hours. Stable right chest porta
cath, accessed. Normal cardiac size and mediastinal contours.
Visualized tracheal air column is within normal limits. Lung volumes
within normal limits. No pneumothorax, pulmonary edema, pleural
effusion or confluent pulmonary opacity. No pulmonary nodule
identified. Negative visible bowel gas. Stable visualized osseous
structures.
IMPRESSION: No acute cardiopulmonary abnormality.
